# Patient Record
Sex: Female | Born: 1985
Health system: Southern US, Community
[De-identification: ages and names within clinical notes are randomized; demographics above are authoritative.]

## PROBLEM LIST (undated history)

## (undated) DIAGNOSIS — E669 Obesity, unspecified: Secondary | ICD-10-CM

## (undated) DIAGNOSIS — I10 Essential (primary) hypertension: Secondary | ICD-10-CM

## (undated) DIAGNOSIS — C419 Malignant neoplasm of bone and articular cartilage, unspecified: Secondary | ICD-10-CM

## (undated) DIAGNOSIS — Z9289 Personal history of other medical treatment: Secondary | ICD-10-CM

## (undated) DIAGNOSIS — F32A Depression, unspecified: Secondary | ICD-10-CM

## (undated) DIAGNOSIS — R519 Headache, unspecified: Secondary | ICD-10-CM

## (undated) DIAGNOSIS — C801 Malignant (primary) neoplasm, unspecified: Secondary | ICD-10-CM

## (undated) HISTORY — PX: TOTAL HIP ARTHROPLASTY: SHX124

## (undated) HISTORY — DX: Depression, unspecified: F32.A

## (undated) HISTORY — DX: Personal history of other medical treatment: Z92.89

## (undated) HISTORY — DX: Headache, unspecified: R51.9

## (undated) HISTORY — DX: Obesity, unspecified: E66.9

## (undated) HISTORY — PX: ABDOMINAL SURGERY: SHX537

---
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
## 2007-06-03 ENCOUNTER — Inpatient Hospital Stay (HOSPITAL_COMMUNITY): Admission: AD | Admit: 2007-06-03 | Discharge: 2007-06-03 | Payer: Self-pay | Admitting: Obstetrics and Gynecology

## 2007-06-10 ENCOUNTER — Ambulatory Visit (HOSPITAL_COMMUNITY): Admission: RE | Admit: 2007-06-10 | Discharge: 2007-06-10 | Payer: Self-pay | Admitting: *Deleted

## 2007-07-28 ENCOUNTER — Inpatient Hospital Stay (HOSPITAL_COMMUNITY): Admission: AD | Admit: 2007-07-28 | Discharge: 2007-07-29 | Payer: Self-pay | Admitting: Obstetrics & Gynecology

## 2007-08-01 ENCOUNTER — Inpatient Hospital Stay (HOSPITAL_COMMUNITY): Admission: AD | Admit: 2007-08-01 | Discharge: 2007-08-01 | Payer: Self-pay | Admitting: Obstetrics and Gynecology

## 2007-08-03 ENCOUNTER — Inpatient Hospital Stay (HOSPITAL_COMMUNITY): Admission: AD | Admit: 2007-08-03 | Discharge: 2007-08-06 | Payer: Self-pay | Admitting: Obstetrics and Gynecology

## 2009-06-22 ENCOUNTER — Emergency Department (HOSPITAL_COMMUNITY): Admission: EM | Admit: 2009-06-22 | Discharge: 2009-06-22 | Payer: Self-pay | Admitting: Emergency Medicine

## 2009-06-29 ENCOUNTER — Emergency Department (HOSPITAL_COMMUNITY): Admission: EM | Admit: 2009-06-29 | Discharge: 2009-06-29 | Payer: Self-pay | Admitting: Emergency Medicine

## 2009-09-20 ENCOUNTER — Inpatient Hospital Stay (HOSPITAL_COMMUNITY): Admission: AD | Admit: 2009-09-20 | Discharge: 2009-09-20 | Payer: Self-pay | Admitting: Obstetrics & Gynecology

## 2009-10-23 ENCOUNTER — Inpatient Hospital Stay (HOSPITAL_COMMUNITY): Admission: AD | Admit: 2009-10-23 | Discharge: 2009-10-23 | Payer: Self-pay | Admitting: Obstetrics and Gynecology

## 2009-11-09 ENCOUNTER — Inpatient Hospital Stay (HOSPITAL_COMMUNITY): Admission: AD | Admit: 2009-11-09 | Discharge: 2009-11-09 | Payer: Self-pay | Admitting: Obstetrics and Gynecology

## 2009-11-11 ENCOUNTER — Inpatient Hospital Stay (HOSPITAL_COMMUNITY): Admission: AD | Admit: 2009-11-11 | Discharge: 2009-11-11 | Payer: Self-pay | Admitting: Obstetrics & Gynecology

## 2009-11-16 ENCOUNTER — Inpatient Hospital Stay (HOSPITAL_COMMUNITY): Admission: AD | Admit: 2009-11-16 | Discharge: 2009-11-16 | Payer: Self-pay | Admitting: Obstetrics and Gynecology

## 2009-11-17 ENCOUNTER — Inpatient Hospital Stay (HOSPITAL_COMMUNITY): Admission: AD | Admit: 2009-11-17 | Discharge: 2009-11-19 | Payer: Self-pay | Admitting: Obstetrics and Gynecology

## 2009-12-22 ENCOUNTER — Inpatient Hospital Stay (HOSPITAL_COMMUNITY): Admission: AD | Admit: 2009-12-22 | Discharge: 2009-12-23 | Payer: Self-pay | Admitting: Obstetrics and Gynecology

## 2009-12-25 ENCOUNTER — Emergency Department (HOSPITAL_COMMUNITY): Admission: EM | Admit: 2009-12-25 | Discharge: 2009-12-26 | Payer: Self-pay | Admitting: Emergency Medicine

## 2009-12-28 ENCOUNTER — Encounter: Payer: Self-pay | Admitting: Cardiology

## 2010-06-28 ENCOUNTER — Emergency Department (HOSPITAL_COMMUNITY): Admission: EM | Admit: 2010-06-28 | Discharge: 2010-06-28 | Payer: Self-pay | Admitting: Emergency Medicine

## 2010-11-21 LAB — URINALYSIS, ROUTINE W REFLEX MICROSCOPIC
Bilirubin Urine: NEGATIVE
Glucose, UA: NEGATIVE mg/dL
Hgb urine dipstick: NEGATIVE
Ketones, ur: NEGATIVE mg/dL
Nitrite: NEGATIVE
Protein, ur: NEGATIVE mg/dL
Specific Gravity, Urine: 1.007 (ref 1.005–1.030)
Urobilinogen, UA: 0.2 mg/dL (ref 0.0–1.0)
pH: 7 (ref 5.0–8.0)

## 2010-11-21 LAB — BASIC METABOLIC PANEL
BUN: 9 mg/dL (ref 6–23)
CO2: 27 mEq/L (ref 19–32)
Calcium: 9.2 mg/dL (ref 8.4–10.5)
Chloride: 104 mEq/L (ref 96–112)
Creatinine, Ser: 0.84 mg/dL (ref 0.4–1.2)
GFR calc Af Amer: >60 mL/min (ref >60)
GFR calc non Af Amer: >60 mL/min (ref >60)
Glucose, Bld: 105 mg/dL — ABNORMAL HIGH (ref 70–99)
Potassium: 3.9 mEq/L (ref 3.5–5.1)
Sodium: 136 mEq/L (ref 135–145)

## 2010-11-21 LAB — POCT PREGNANCY, URINE: Preg Test, Ur: NEGATIVE

## 2010-11-21 LAB — D-DIMER, QUANTITATIVE: D-Dimer, Quant: 0.47 ug/mL-FEU (ref 0.00–0.48)

## 2010-11-25 LAB — COMPREHENSIVE METABOLIC PANEL
ALT: 18 U/L (ref 0–35)
AST: 30 U/L (ref 0–37)
Albumin: 2.5 g/dL — ABNORMAL LOW (ref 3.5–5.2)
Alkaline Phosphatase: 83 U/L (ref 39–117)
BUN: 4 mg/dL — ABNORMAL LOW (ref 6–23)
CO2: 21 mEq/L (ref 19–32)
Calcium: 8.8 mg/dL (ref 8.4–10.5)
Chloride: 106 mEq/L (ref 96–112)
Creatinine, Ser: 0.8 mg/dL (ref 0.4–1.2)
GFR calc Af Amer: >60 mL/min (ref >60)
GFR calc non Af Amer: >60 mL/min (ref >60)
Glucose, Bld: 88 mg/dL (ref 70–99)
Potassium: 3.8 mEq/L (ref 3.5–5.1)
Sodium: 136 mEq/L (ref 135–145)
Total Bilirubin: 0.6 mg/dL (ref 0.3–1.2)
Total Protein: 5.9 g/dL — ABNORMAL LOW (ref 6.0–8.3)

## 2010-11-25 LAB — URINALYSIS, ROUTINE W REFLEX MICROSCOPIC
Glucose, UA: NEGATIVE mg/dL
Hgb urine dipstick: NEGATIVE
Ketones, ur: >80 mg/dL — AB
Leukocytes, UA: NEGATIVE
Nitrite: NEGATIVE
Protein, ur: 30 mg/dL — AB
Specific Gravity, Urine: 1.025 (ref 1.005–1.030)
Urobilinogen, UA: 0.2 mg/dL (ref 0.0–1.0)
pH: 6.5 (ref 5.0–8.0)

## 2010-11-25 LAB — URINE MICROSCOPIC-ADD ON

## 2010-11-27 LAB — CBC
HCT: 34.9 % — ABNORMAL LOW (ref 36.0–46.0)
HCT: 35.3 % — ABNORMAL LOW (ref 36.0–46.0)
Hemoglobin: 11.3 g/dL — ABNORMAL LOW (ref 12.0–15.0)
Hemoglobin: 11.4 g/dL — ABNORMAL LOW (ref 12.0–15.0)
MCHC: 32 g/dL (ref 30.0–36.0)
MCHC: 32.6 g/dL (ref 30.0–36.0)
MCV: 74.9 fL — ABNORMAL LOW (ref 78.0–100.0)
MCV: 75.5 fL — ABNORMAL LOW (ref 78.0–100.0)
Platelets: 333 10*3/uL (ref 150–400)
Platelets: 352 10*3/uL (ref 150–400)
RBC: 4.65 MIL/uL (ref 3.87–5.11)
RBC: 4.68 MIL/uL (ref 3.87–5.11)
RDW: 16.6 % — ABNORMAL HIGH (ref 11.5–15.5)
RDW: 16.7 % — ABNORMAL HIGH (ref 11.5–15.5)
WBC: 6 10*3/uL (ref 4.0–10.5)
WBC: 8.2 10*3/uL (ref 4.0–10.5)

## 2010-11-27 LAB — DIFFERENTIAL
Basophils Absolute: 0.1 10*3/uL (ref 0.0–0.1)
Basophils Relative: 1 % (ref 0–1)
Eosinophils Absolute: 0.3 10*3/uL (ref 0.0–0.7)
Eosinophils Relative: 4 % (ref 0–5)
Lymphocytes Relative: 34 % (ref 12–46)
Lymphs Abs: 2.8 10*3/uL (ref 0.7–4.0)
Monocytes Absolute: 0.7 10*3/uL (ref 0.1–1.0)
Monocytes Relative: 9 % (ref 3–12)
Neutro Abs: 4.2 10*3/uL (ref 1.7–7.7)
Neutrophils Relative %: 52 % (ref 43–77)

## 2010-11-27 LAB — POCT CARDIAC MARKERS
CKMB, poc: <1 ng/mL — ABNORMAL LOW (ref 1.0–8.0)
Troponin i, poc: <0.05 ng/mL (ref 0.00–0.09)

## 2010-11-27 LAB — COMPREHENSIVE METABOLIC PANEL
ALT: 56 U/L — ABNORMAL HIGH (ref 0–35)
AST: 46 U/L — ABNORMAL HIGH (ref 0–37)
Albumin: 4 g/dL (ref 3.5–5.2)
Alkaline Phosphatase: 64 U/L (ref 39–117)
BUN: 13 mg/dL (ref 6–23)
CO2: 25 mEq/L (ref 19–32)
Calcium: 9.6 mg/dL (ref 8.4–10.5)
Chloride: 106 mEq/L (ref 96–112)
Creatinine, Ser: 0.86 mg/dL (ref 0.4–1.2)
GFR calc Af Amer: >60 mL/min (ref >60)
GFR calc non Af Amer: >60 mL/min (ref >60)
Glucose, Bld: 88 mg/dL (ref 70–99)
Potassium: 3.9 mEq/L (ref 3.5–5.1)
Sodium: 136 mEq/L (ref 135–145)
Total Bilirubin: 0.6 mg/dL (ref 0.3–1.2)
Total Protein: 7.2 g/dL (ref 6.0–8.3)

## 2010-11-27 LAB — URINALYSIS, ROUTINE W REFLEX MICROSCOPIC
Bilirubin Urine: NEGATIVE
Bilirubin Urine: NEGATIVE
Glucose, UA: NEGATIVE mg/dL
Glucose, UA: NEGATIVE mg/dL
Hgb urine dipstick: NEGATIVE
Hgb urine dipstick: NEGATIVE
Ketones, ur: NEGATIVE mg/dL
Ketones, ur: NEGATIVE mg/dL
Nitrite: NEGATIVE
Nitrite: NEGATIVE
Protein, ur: NEGATIVE mg/dL
Protein, ur: NEGATIVE mg/dL
Specific Gravity, Urine: 1.016 (ref 1.005–1.030)
Specific Gravity, Urine: 1.02 (ref 1.005–1.030)
Urobilinogen, UA: 0.2 mg/dL (ref 0.0–1.0)
Urobilinogen, UA: 1 mg/dL (ref 0.0–1.0)
pH: 6 (ref 5.0–8.0)
pH: 6.5 (ref 5.0–8.0)

## 2010-11-27 LAB — HEPATIC FUNCTION PANEL
ALT: 44 U/L — ABNORMAL HIGH (ref 0–35)
ALT: 52 U/L — ABNORMAL HIGH (ref 0–35)
AST: 36 U/L (ref 0–37)
AST: 40 U/L — ABNORMAL HIGH (ref 0–37)
Albumin: 3.8 g/dL (ref 3.5–5.2)
Alkaline Phosphatase: 58 U/L (ref 39–117)
Alkaline Phosphatase: 61 U/L (ref 39–117)
Bilirubin, Direct: 0.1 mg/dL (ref 0.0–0.3)
Bilirubin, Direct: 0.1 mg/dL (ref 0.0–0.3)
Indirect Bilirubin: 0.2 mg/dL — ABNORMAL LOW (ref 0.3–0.9)
Total Bilirubin: 0.3 mg/dL (ref 0.3–1.2)
Total Protein: 7.1 g/dL (ref 6.0–8.3)

## 2010-11-27 LAB — URIC ACID: Uric Acid, Serum: 5.8 mg/dL (ref 2.4–7.0)

## 2010-11-27 LAB — MRSA PCR SCREENING: MRSA by PCR: NEGATIVE

## 2010-11-27 LAB — BASIC METABOLIC PANEL
BUN: 16 mg/dL (ref 6–23)
CO2: 25 mEq/L (ref 19–32)
Calcium: 9.6 mg/dL (ref 8.4–10.5)
Chloride: 105 mEq/L (ref 96–112)
Creatinine, Ser: 1.45 mg/dL — ABNORMAL HIGH (ref 0.4–1.2)
GFR calc Af Amer: 54 mL/min — ABNORMAL LOW (ref >60)
GFR calc non Af Amer: 45 mL/min — ABNORMAL LOW (ref >60)
Glucose, Bld: 89 mg/dL (ref 70–99)
Potassium: 4.1 mEq/L (ref 3.5–5.1)
Sodium: 139 mEq/L (ref 135–145)

## 2010-11-27 LAB — D-DIMER, QUANTITATIVE: D-Dimer, Quant: 0.33 ug/mL-FEU (ref 0.00–0.48)

## 2010-11-27 LAB — URINE MICROSCOPIC-ADD ON

## 2010-11-27 LAB — LACTATE DEHYDROGENASE: LDH: 149 U/L (ref 94–250)

## 2010-11-27 LAB — ALT: ALT: 52 U/L — ABNORMAL HIGH (ref 0–35)

## 2010-11-27 LAB — AST: AST: 39 U/L — ABNORMAL HIGH (ref 0–37)

## 2010-11-27 LAB — MAGNESIUM: Magnesium: 6.1 mg/dL (ref 1.5–2.5)

## 2010-11-28 LAB — URINALYSIS, ROUTINE W REFLEX MICROSCOPIC
Hgb urine dipstick: NEGATIVE
Nitrite: NEGATIVE
Specific Gravity, Urine: 1.01 (ref 1.005–1.030)
Urobilinogen, UA: 0.2 mg/dL (ref 0.0–1.0)

## 2010-12-02 LAB — COMPREHENSIVE METABOLIC PANEL
ALT: 15 U/L (ref 0–35)
ALT: 16 U/L (ref 0–35)
ALT: 18 U/L (ref 0–35)
AST: 21 U/L (ref 0–37)
AST: 23 U/L (ref 0–37)
AST: 27 U/L (ref 0–37)
Albumin: 2.3 g/dL — ABNORMAL LOW (ref 3.5–5.2)
Alkaline Phosphatase: 102 U/L (ref 39–117)
BUN: 8 mg/dL (ref 6–23)
CO2: 23 mEq/L (ref 19–32)
CO2: 23 mEq/L (ref 19–32)
CO2: 25 mEq/L (ref 19–32)
Calcium: 8.8 mg/dL (ref 8.4–10.5)
Calcium: 9.1 mg/dL (ref 8.4–10.5)
Chloride: 105 mEq/L (ref 96–112)
Chloride: 106 mEq/L (ref 96–112)
Creatinine, Ser: 0.69 mg/dL (ref 0.4–1.2)
Creatinine, Ser: 0.79 mg/dL (ref 0.4–1.2)
GFR calc Af Amer: >60 mL/min (ref >60)
GFR calc Af Amer: >60 mL/min (ref >60)
GFR calc Af Amer: >60 mL/min (ref >60)
GFR calc non Af Amer: >60 mL/min (ref >60)
GFR calc non Af Amer: >60 mL/min (ref >60)
GFR calc non Af Amer: >60 mL/min (ref >60)
Glucose, Bld: 79 mg/dL (ref 70–99)
Potassium: 4.1 mEq/L (ref 3.5–5.1)
Sodium: 133 mEq/L — ABNORMAL LOW (ref 135–145)
Sodium: 135 mEq/L (ref 135–145)
Sodium: 135 mEq/L (ref 135–145)
Total Bilirubin: 0.2 mg/dL — ABNORMAL LOW (ref 0.3–1.2)
Total Bilirubin: 0.3 mg/dL (ref 0.3–1.2)
Total Protein: 4.9 g/dL — ABNORMAL LOW (ref 6.0–8.3)
Total Protein: 5.9 g/dL — ABNORMAL LOW (ref 6.0–8.3)

## 2010-12-02 LAB — RPR: RPR Ser Ql: NONREACTIVE

## 2010-12-02 LAB — URINALYSIS, ROUTINE W REFLEX MICROSCOPIC
Bilirubin Urine: NEGATIVE
Glucose, UA: 250 mg/dL — AB
Glucose, UA: NEGATIVE mg/dL
Ketones, ur: NEGATIVE mg/dL
Nitrite: NEGATIVE
Protein, ur: NEGATIVE mg/dL
Specific Gravity, Urine: 1.015 (ref 1.005–1.030)
Urobilinogen, UA: 1 mg/dL (ref 0.0–1.0)
pH: 7.5 (ref 5.0–8.0)

## 2010-12-02 LAB — URIC ACID
Uric Acid, Serum: 3.7 mg/dL (ref 2.4–7.0)
Uric Acid, Serum: 3.9 mg/dL (ref 2.4–7.0)

## 2010-12-02 LAB — CBC
HCT: 29.9 % — ABNORMAL LOW (ref 36.0–46.0)
Hemoglobin: 10.1 g/dL — ABNORMAL LOW (ref 12.0–15.0)
Hemoglobin: 9.3 g/dL — ABNORMAL LOW (ref 12.0–15.0)
MCHC: 32.5 g/dL (ref 30.0–36.0)
MCHC: 32.7 g/dL (ref 30.0–36.0)
MCV: 78.9 fL (ref 78.0–100.0)
MCV: 79.3 fL (ref 78.0–100.0)
Platelets: 272 10*3/uL (ref 150–400)
Platelets: 292 10*3/uL (ref 150–400)
RBC: 3.61 MIL/uL — ABNORMAL LOW (ref 3.87–5.11)
RBC: 3.84 MIL/uL — ABNORMAL LOW (ref 3.87–5.11)
RBC: 3.94 MIL/uL (ref 3.87–5.11)
RBC: 3.96 MIL/uL (ref 3.87–5.11)
RDW: 14.4 % (ref 11.5–15.5)
RDW: 14.7 % (ref 11.5–15.5)
WBC: 12.2 10*3/uL — ABNORMAL HIGH (ref 4.0–10.5)
WBC: 14.5 10*3/uL — ABNORMAL HIGH (ref 4.0–10.5)

## 2010-12-02 LAB — RH IMMUNE GLOB WKUP(>/=20WKS)(NOT WOMEN'S HOSP): Fetal Screen: NEGATIVE

## 2010-12-02 LAB — URINE MICROSCOPIC-ADD ON

## 2010-12-13 LAB — POCT I-STAT, CHEM 8
BUN: 4 mg/dL — ABNORMAL LOW (ref 6–23)
Calcium, Ion: 1.23 mmol/L (ref 1.12–1.32)
Chloride: 105 meq/L (ref 96–112)
Creatinine, Ser: 0.5 mg/dL (ref 0.4–1.2)
Creatinine, Ser: 0.6 mg/dL (ref 0.4–1.2)
Glucose, Bld: 131 mg/dL — ABNORMAL HIGH (ref 70–99)
Glucose, Bld: 99 mg/dL (ref 70–99)
HCT: 33 % — ABNORMAL LOW (ref 36.0–46.0)
HCT: 35 % — ABNORMAL LOW (ref 36.0–46.0)
Hemoglobin: 11.2 g/dL — ABNORMAL LOW (ref 12.0–15.0)
Hemoglobin: 11.9 g/dL — ABNORMAL LOW (ref 12.0–15.0)
Potassium: 3.5 mEq/L (ref 3.5–5.1)
Potassium: 3.9 meq/L (ref 3.5–5.1)
Sodium: 136 mEq/L (ref 135–145)
Sodium: 137 meq/L (ref 135–145)
TCO2: 22 mmol/L (ref 0–100)
TCO2: 22 mmol/L (ref 0–100)

## 2010-12-13 LAB — URINALYSIS, ROUTINE W REFLEX MICROSCOPIC
Bilirubin Urine: NEGATIVE
Glucose, UA: 250 mg/dL — AB
Ketones, ur: NEGATIVE mg/dL
Nitrite: NEGATIVE
Specific Gravity, Urine: 1.016 (ref 1.005–1.030)
pH: 8 (ref 5.0–8.0)

## 2010-12-13 LAB — GLUCOSE, CAPILLARY: Glucose-Capillary: 97 mg/dL (ref 70–99)

## 2010-12-13 LAB — URINE MICROSCOPIC-ADD ON

## 2011-03-18 ENCOUNTER — Other Ambulatory Visit: Payer: Self-pay | Admitting: Cardiology

## 2011-03-18 MED ORDER — LOSARTAN POTASSIUM 50 MG PO TABS: 50.0000 mg | ORAL_TABLET | Freq: Every day | ORAL | Status: DC

## 2011-03-18 NOTE — Telephone Encounter (Signed)
Med refill

## 2011-04-24 ENCOUNTER — Other Ambulatory Visit: Payer: Self-pay | Admitting: *Deleted

## 2011-04-24 MED ORDER — LOSARTAN POTASSIUM 50 MG PO TABS: 50.0000 mg | ORAL_TABLET | Freq: Every day | ORAL | Status: DC

## 2011-04-24 NOTE — Telephone Encounter (Signed)
escribe medication per fax request  

## 2011-05-31 ENCOUNTER — Other Ambulatory Visit: Payer: Self-pay | Admitting: *Deleted

## 2011-05-31 MED ORDER — LOSARTAN POTASSIUM 50 MG PO TABS: 50.0000 mg | ORAL_TABLET | Freq: Every day | ORAL | Status: DC

## 2011-05-31 NOTE — Telephone Encounter (Signed)
Refilled Meds from fax  

## 2011-06-17 ENCOUNTER — Other Ambulatory Visit: Payer: Self-pay | Admitting: Nurse Practitioner

## 2011-06-17 NOTE — Telephone Encounter (Signed)
Pt out of Losartan, Lawson Fiscal Gerhardt's name is on refill. Please call into [facility] in [facility]. Pt knows she needs to make appt but doesn't have insurance right now and pt said her health condition hasn't changed she just needs the RX to manage her HTN.   According to pt records pt was transferred from Dr. Deborah Chalk to Dr. Cyndia Bent but pt hasn't ever seen Dr. Cyndia Bent.

## 2011-06-17 NOTE — Telephone Encounter (Signed)
Pt called. Pt wants refill of losartan [facility] [place] pt is out of pills pharmacy has faxed twice

## 2011-06-18 LAB — CBC
HCT: 30 — ABNORMAL LOW
HCT: 32.5 — ABNORMAL LOW
HCT: 36
Hemoglobin: 10.5 — ABNORMAL LOW
MCHC: 34.4
MCHC: 35.1
MCV: 83.8
MCV: 83.9
MCV: 85.2
Platelets: 281
RBC: 4.01
RBC: 4.29
RDW: 13.5
RDW: 14
RDW: 14
WBC: 20.7 — ABNORMAL HIGH

## 2011-06-18 LAB — COMPREHENSIVE METABOLIC PANEL
ALT: 16
AST: 22
Alkaline Phosphatase: 67
BUN: 13
Calcium: 9.7
Creatinine, Ser: 0.75
GFR calc Af Amer: >60
Glucose, Bld: 104 — ABNORMAL HIGH
Potassium: 3.8
Sodium: 130 — ABNORMAL LOW
Total Protein: 6
Total Protein: 6.3

## 2011-06-18 LAB — URINALYSIS, ROUTINE W REFLEX MICROSCOPIC
Bilirubin Urine: NEGATIVE
Bilirubin Urine: NEGATIVE
Glucose, UA: NEGATIVE
Glucose, UA: NEGATIVE
Hgb urine dipstick: NEGATIVE
Hgb urine dipstick: NEGATIVE
Ketones, ur: 15 — AB
Ketones, ur: NEGATIVE
Ketones, ur: NEGATIVE
Nitrite: NEGATIVE
Protein, ur: NEGATIVE
Specific Gravity, Urine: 1.015
Urobilinogen, UA: 0.2
Urobilinogen, UA: 0.2
pH: 6

## 2011-06-18 LAB — URIC ACID: Uric Acid, Serum: 4.1

## 2011-06-18 LAB — LACTATE DEHYDROGENASE: LDH: 125

## 2011-06-18 MED ORDER — LOSARTAN POTASSIUM 50 MG PO TABS: 50.0000 mg | ORAL_TABLET | Freq: Every day | ORAL | Status: AC

## 2011-06-18 NOTE — Telephone Encounter (Signed)
CALLED PATIENT TO LET HER KNOW THAT [facility] WILL GIVE HER 3 REFILLS BUT AFTERWARDS SHE HAS TO MAKE AN APPT. PATIENT DID NOT ANSWER. I HAVE REFILLED THE RX

## 2011-06-20 LAB — URINALYSIS, ROUTINE W REFLEX MICROSCOPIC
Glucose, UA: NEGATIVE
Leukocytes, UA: NEGATIVE
Nitrite: NEGATIVE
Protein, ur: 30 — AB

## 2011-06-20 LAB — RH IMMUNE GLOBULIN WORKUP (NOT WOMEN'S HOSP)

## 2012-01-20 ENCOUNTER — Emergency Department (HOSPITAL_COMMUNITY)
Admission: EM | Admit: 2012-01-20 | Discharge: 2012-01-20 | Disposition: A | Discharge status: Home / Self Care | Payer: 59 | Source: Home / Self Care | Attending: Emergency Medicine | Admitting: Emergency Medicine

## 2012-01-20 ENCOUNTER — Encounter (HOSPITAL_COMMUNITY): Payer: Self-pay

## 2012-01-20 DIAGNOSIS — H109 Unspecified conjunctivitis: Secondary | ICD-10-CM

## 2012-01-20 HISTORY — DX: Essential (primary) hypertension: I10

## 2012-01-20 MED ORDER — TETRACAINE HCL 0.5 % OP SOLN
OPHTHALMIC | Status: AC
  Filled 2012-01-20: qty 2

## 2012-01-20 MED ORDER — CIPROFLOXACIN HCL 0.3 % OP SOLN: OPHTHALMIC | Status: DC

## 2012-01-20 MED ORDER — TETRACAINE HCL 0.5 % OP SOLN
1.0000 [drp] | Freq: Once | OPHTHALMIC | Status: AC
  Administered 2012-01-20: 1 [drp] via OPHTHALMIC

## 2012-01-20 NOTE — ED Provider Notes (Signed)
Chief Complaint  Patient presents with  . 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 She denies any eye pain, photophobia, or change in her vision. She has not had any involvement of the right eye. She notes no nasal congestion, rhinorrhea, sore throat, adenopathy, cough, or fever.  Review of Systems:  Other than noted above, the patient denies any of the following symptoms: Systemic:  No fever, chills, sweats, fatigue, or weight loss. Eye:  No redness, eye pain, photophobia, discharge, blurred vision, or diplopia. ENT:  No nasal congestion, rhinorrhea, or sore throat. Lymphatic:  No adenopathy. Skin:  No rash or pruritis.  PMFSH:  Past medical history, family history, social history, meds, and allergies were reviewed.  Physical Exam:   Vital signs:  BP 115/77  Pulse 77  Temp(Src) 98.7 F (37.1 C) (Oral)  Resp 18  SpO2 100%  LMP 01/05/2012 General:  Alert and in no distress. Eye:  The lids and periorbital tissues are normal. She has moderate injection of the conjunctiva of the left eye with no discharge or crusting. Cornea was intact a fluorescein staining. Anterior chamber was normal. Funduscopic exam was normal. PERRLA, full EOMs. ENT:  TMs and canals clear.  Nasal mucosa normal.  No intra-oral lesions, mucous membranes moist, pharynx clear. Neck:  No adenopathy tenderness or mass. Skin:  Clear, warm and dry.  Assessment:  The encounter diagnosis was Conjunctivitis.  Plan:   1.  The following meds were prescribed:   New Prescriptions   CIPROFLOXACIN (CILOXAN) 0.3 % OPHTHALMIC SOLUTION    1 drop in affected eye every 3 hours while awake.   2.  The patient was instructed in symptomatic care and handouts were given. 3.  The patient was told to return if becoming worse in any way, if no better in 3 or 4  days, and given some red flag symptoms that would indicate earlier return.     Reuben Likes, MD 01/20/12 [phone]

## 2012-01-20 NOTE — Discharge Instructions (Signed)
Conjunctivitis Conjunctivitis is commonly called "pink eye." Conjunctivitis can be caused by bacterial or viral infection, allergies, or injuries. There is usually redness of the lining of the eye, itching, discomfort, and sometimes discharge. There may be deposits of matter along the eyelids. A viral infection usually causes a watery discharge, while a bacterial infection causes a yellowish, thick discharge. Pink eye is very contagious and spreads by direct contact. You may be given antibiotic eyedrops as part of your treatment. Before using your eye medicine, remove all drainage from the eye by washing gently with warm water and cotton balls. Continue to use the medication until you have awakened 2 mornings in a row without discharge from the eye. Do not rub your eye. This increases the irritation and helps spread infection. Use separate towels from other household members. Wash your hands with soap and water before and after touching your eyes. Use cold compresses to reduce pain and sunglasses to relieve irritation from light. Do not wear contact lenses or wear eye makeup until the infection is gone. SEEK MEDICAL CARE IF:   Your symptoms are not better after 3 days of treatment.   You have increased pain or trouble seeing.   The outer eyelids become very red or swollen.  Document Released: 10/03/2004 Document Revised: 08/15/2011 Document Reviewed: 08/26/2005 Exit[employer] Patient Information 2012 [employer]. 

## 2012-01-20 NOTE — ED Notes (Signed)
Sent here by her manager to r/o pinkeye; woke w eye swellnig, matted, yellow drainage; denies injury

## 2012-01-27 ENCOUNTER — Encounter: Payer: Self-pay | Admitting: *Deleted

## 2012-06-30 ENCOUNTER — Emergency Department (HOSPITAL_COMMUNITY)
Admission: EM | Admit: 2012-06-30 | Discharge: 2012-06-30 | Disposition: A | Discharge status: Home / Self Care | Payer: 59 | Source: Home / Self Care | Attending: Family Medicine | Admitting: Family Medicine

## 2012-06-30 ENCOUNTER — Emergency Department (INDEPENDENT_AMBULATORY_CARE_PROVIDER_SITE_OTHER): Payer: 59

## 2012-06-30 ENCOUNTER — Encounter (HOSPITAL_COMMUNITY): Payer: Self-pay | Admitting: Emergency Medicine

## 2012-06-30 DIAGNOSIS — M25559 Pain in unspecified hip: Secondary | ICD-10-CM

## 2012-06-30 DIAGNOSIS — M25552 Pain in left hip: Secondary | ICD-10-CM

## 2012-06-30 MED ORDER — CYCLOBENZAPRINE HCL 10 MG PO TABS: 10.0000 mg | ORAL_TABLET | Freq: Two times a day (BID) | ORAL | Status: DC | PRN

## 2012-06-30 MED ORDER — TRAMADOL HCL 50 MG PO TABS: 50.0000 mg | ORAL_TABLET | Freq: Four times a day (QID) | ORAL | Status: DC | PRN

## 2012-06-30 MED ORDER — NAPROXEN 500 MG PO TABS: 500.0000 mg | ORAL_TABLET | Freq: Two times a day (BID) | ORAL | Status: DC

## 2012-06-30 NOTE — ED Notes (Addendum)
Pt states for the past two months she has been having pain in her left hip and knee.   Pt c/o a constant throbbing pain in hip/ knee. Hip area feels like it needs to be popped. Pt had husband to rotate hip to try and make it pop but no relief.  Her left leg is dead weight to were she has to pick leg up to put it in car.   Hip pain is worse with rotation and causing knee to hurt and making her walk with a limp.  Pt has tried ibuprofen with only little relief but seems to not be working now.  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

## 2012-06-30 NOTE — ED Provider Notes (Signed)
History     CSN: [id number]  Arrival date & time 06/30/12  1549   First MD Initiated Contact with Patient 06/30/12 1701      Chief Complaint  Patient presents with  . Hip Pain  . Knee Pain    (Consider location/radiation/quality/duration/timing/severity/associated sxs/prior treatment) HPI Comments: 26 year old female here complaining of 2 month left anterior hip pain with intermittent exacerbations worse with walking or elevating the left thigh needs to carry her leg up to get in the car due to pain. But denies weakness. Also hip rotation agravates her pain. Pain worse in the last few days. Patient describes as she has the feeling that her "hip needs to pop back in place for the pain to resolve". 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 Denies any falls or direct injury to her hip. No back pain. No associated low extremity numbness, weakness or paresthesias. Denies general malaise fever or chills. Denies abdominal pain, pelvic pain, dysuria or hematuria. No vaginal discharge. The patient reports she started to have associated left knee pain during the last few weeks. She has taken inconsistently ibuprofen over-the-counter with minimal relief.   Past Medical History  Diagnosis Date  . Hypertension   . Obesity     Past Surgical History  Procedure Date  . None'     Family History  Problem Relation Age of Onset  . Hypertension    . Cancer      History  Substance Use Topics  . Smoking status: Never Smoker   . Smokeless tobacco: Not on file  . Alcohol Use: Yes     socially    OB History    Grav Para Term Preterm Abortions TAB SAB Ect Mult Living                  Review of Systems  Constitutional: Negative for fever and chills.  Genitourinary: Negative for dysuria, frequency, hematuria, flank pain, vaginal bleeding, menstrual problem and pelvic pain.  Musculoskeletal:  Negative for back pain and joint swelling.       As per history of present illness  Skin: Negative for rash.    Allergies  Review of patient's allergies indicates no known allergies.  Home Medications   Current Outpatient Rx  Name Route Sig Dispense Refill  . BENAZEPRIL HCL 10 MG PO TABS Oral Take 10 mg by mouth daily.    Marland Kitchen CIPROFLOXACIN HCL 0.3 % OP SOLN  1 drop in affected eye every 3 hours while awake. 5 mL 0  . CYCLOBENZAPRINE HCL 10 MG PO TABS Oral Take 1 tablet (10 mg total) by mouth 2 (two) times daily as needed for muscle spasms. 20 tablet 0  . FLUOXETINE HCL 20 MG PO TABS Oral Take 20 mg by mouth daily.      Marland Kitchen NAPROXEN 500 MG PO TABS Oral Take 1 tablet (500 mg total) by mouth 2 (two) times daily with a meal. 20 tablet 0  . TRAMADOL HCL 50 MG PO TABS Oral Take 1 tablet (50 mg total) by mouth every 6 (six) hours as needed for pain. 20 tablet 0    BP 111/87  Pulse 97  Temp 99.5 F (37.5 C) (Oral)  Resp 16  SpO2 100%  LMP 06/16/2012  Physical Exam  Nursing note and vitals reviewed. Constitutional: She is oriented to person, place, and time. She appears well-developed and well-nourished. No  distress.       Overweight  HENT:  Head: Normocephalic and atraumatic.  Cardiovascular: Normal heart sounds.   Pulmonary/Chest: Breath sounds normal.  Abdominal: Soft. She exhibits no distension. There is no tenderness.       No costovertebral tenderness  Musculoskeletal:       Left Hip: No erythema or rash. No obvious swelling or deformity. Tender to palpation anteriority. Impress increased tone and tenderness to palpation over iliofemoral ligament. Pain with active left thigh flexion. Increased pain with external rotation. No clicks. No tenderness to palpation over SI joint.  Spine: Central. No tenderness over bony prominences. No paravertebral tenderness. Full range of motion. Entire left lower extremity appears neurovascularly intact.   Neurological: She is alert and oriented to  person, place, and time.  Skin: No rash noted. She is not diaphoretic.    ED Course  Procedures (including critical care time)  Labs Reviewed - No data to display Dg Hip Complete Left  06/30/2012  *RADIOLOGY REPORT*  Clinical Data: Left hip pain.  No known injury.  LEFT HIP - COMPLETE 2+ VIEW  Comparison: None.  Findings: No evidence of left hip fracture or dislocation.  Degenerative spurring is seen involving the femoral head without significant joint space narrowing.  Cystic lucency is also seen in the left femoral head and neck, suspicious for subchondral geode formation given the degenerative spurring.  IMPRESSION:  1.  No acute findings. 2.  Left femoral head degenerative spurring.  Cystic lucency in the left femoral head and neck is suspicious for subchondral geode formation.   Non-emergent hip MRI suggested for further evaluation.   Original Report Authenticated By: Danae Orleans, M.D.      1. Hip pain, left       MDM  Discussed x-ray findings with patient. Prescribe naproxen, tramadol and Flexeril. Recommended orthopedic followup.        Sharin Grave, MD 07/02/12 [phone]

## 2012-07-09 ENCOUNTER — Other Ambulatory Visit (HOSPITAL_COMMUNITY): Payer: Self-pay | Admitting: Orthopedic Surgery

## 2012-07-09 DIAGNOSIS — M8700 Idiopathic aseptic necrosis of unspecified bone: Secondary | ICD-10-CM

## 2012-07-10 ENCOUNTER — Ambulatory Visit (HOSPITAL_COMMUNITY)
Admission: RE | Admit: 2012-07-10 | Discharge: 2012-07-10 | Disposition: A | Discharge status: Home / Self Care | Payer: 59 | Source: Ambulatory Visit | Attending: Orthopedic Surgery | Admitting: Orthopedic Surgery

## 2012-07-10 ENCOUNTER — Other Ambulatory Visit (HOSPITAL_COMMUNITY): Payer: 59

## 2012-07-10 DIAGNOSIS — M25559 Pain in unspecified hip: Secondary | ICD-10-CM | POA: Insufficient documentation

## 2012-07-10 DIAGNOSIS — M8700 Idiopathic aseptic necrosis of unspecified bone: Secondary | ICD-10-CM | POA: Insufficient documentation

## 2012-07-10 LAB — CREATININE, SERUM
GFR calc Af Amer: >90 mL/min (ref >90)
GFR calc non Af Amer: >90 mL/min (ref >90)

## 2012-07-10 MED ORDER — GADOBENATE DIMEGLUMINE 529 MG/ML IV SOLN: 17.0000 mL | Freq: Once | INTRAVENOUS | Status: AC | PRN

## 2012-08-17 ENCOUNTER — Encounter (HOSPITAL_COMMUNITY): Payer: Self-pay

## 2012-08-17 ENCOUNTER — Emergency Department (HOSPITAL_COMMUNITY): Payer: 59

## 2012-08-17 ENCOUNTER — Emergency Department (HOSPITAL_COMMUNITY)
Admission: EM | Admit: 2012-08-17 | Discharge: 2012-08-17 | Disposition: A | Discharge status: Home / Self Care | Payer: 59 | Attending: Emergency Medicine | Admitting: Emergency Medicine

## 2012-08-17 DIAGNOSIS — R002 Palpitations: Secondary | ICD-10-CM | POA: Insufficient documentation

## 2012-08-17 DIAGNOSIS — Z96649 Presence of unspecified artificial hip joint: Secondary | ICD-10-CM | POA: Insufficient documentation

## 2012-08-17 DIAGNOSIS — Z859 Personal history of malignant neoplasm, unspecified: Secondary | ICD-10-CM | POA: Insufficient documentation

## 2012-08-17 DIAGNOSIS — R42 Dizziness and giddiness: Secondary | ICD-10-CM | POA: Insufficient documentation

## 2012-08-17 DIAGNOSIS — I10 Essential (primary) hypertension: Secondary | ICD-10-CM | POA: Insufficient documentation

## 2012-08-17 DIAGNOSIS — E669 Obesity, unspecified: Secondary | ICD-10-CM | POA: Insufficient documentation

## 2012-08-17 DIAGNOSIS — Z79899 Other long term (current) drug therapy: Secondary | ICD-10-CM | POA: Insufficient documentation

## 2012-08-17 HISTORY — DX: Malignant (primary) neoplasm, unspecified: C80.1

## 2012-08-17 LAB — COMPREHENSIVE METABOLIC PANEL
AST: 23 U/L (ref 0–37)
Albumin: 3.3 g/dL — ABNORMAL LOW (ref 3.5–5.2)
Alkaline Phosphatase: 69 U/L (ref 39–117)
CO2: 29 mEq/L (ref 19–32)
Chloride: 103 mEq/L (ref 96–112)
Creatinine, Ser: 0.78 mg/dL (ref 0.50–1.10)
GFR calc non Af Amer: >90 mL/min (ref >90)
Potassium: 4.7 mEq/L (ref 3.5–5.1)
Total Bilirubin: 0.2 mg/dL — ABNORMAL LOW (ref 0.3–1.2)

## 2012-08-17 LAB — URINALYSIS, ROUTINE W REFLEX MICROSCOPIC
Glucose, UA: NEGATIVE mg/dL
Ketones, ur: NEGATIVE mg/dL
Nitrite: NEGATIVE
Specific Gravity, Urine: 1.008 (ref 1.005–1.030)
pH: 7 (ref 5.0–8.0)

## 2012-08-17 LAB — CBC WITH DIFFERENTIAL/PLATELET
Basophils Absolute: 0.1 10*3/uL (ref 0.0–0.1)
HCT: 33 % — ABNORMAL LOW (ref 36.0–46.0)
Hemoglobin: 10.7 g/dL — ABNORMAL LOW (ref 12.0–15.0)
Lymphocytes Relative: 23 % (ref 12–46)
Monocytes Absolute: 0.7 10*3/uL (ref 0.1–1.0)
Monocytes Relative: 9 % (ref 3–12)
Neutro Abs: 5 10*3/uL (ref 1.7–7.7)
Neutrophils Relative %: 63 % (ref 43–77)
RDW: 12.8 % (ref 11.5–15.5)
WBC: 7.9 10*3/uL (ref 4.0–10.5)

## 2012-08-17 LAB — TYPE AND SCREEN

## 2012-08-17 LAB — URINE MICROSCOPIC-ADD ON

## 2012-08-17 MED ORDER — HYDROMORPHONE HCL 2 MG PO TABS
4.0000 mg | ORAL_TABLET | Freq: Once | ORAL | Status: AC
  Administered 2012-08-17: 4 mg via ORAL
  Filled 2012-08-17: qty 2

## 2012-08-17 MED ORDER — ENOXAPARIN SODIUM 40 MG/0.4ML ~~LOC~~ SOLN
40.0000 mg | Freq: Once | SUBCUTANEOUS | Status: AC
  Administered 2012-08-17: 40 mg via SUBCUTANEOUS
  Filled 2012-08-17: qty 0.4

## 2012-08-17 MED ORDER — CIPROFLOXACIN HCL 500 MG PO TABS: 500.0000 mg | ORAL_TABLET | Freq: Two times a day (BID) | ORAL | Status: DC

## 2012-08-17 MED ORDER — CIPROFLOXACIN HCL 500 MG PO TABS
500.0000 mg | ORAL_TABLET | Freq: Once | ORAL | Status: AC
  Administered 2012-08-17: 500 mg via ORAL
  Filled 2012-08-17: qty 1

## 2012-08-17 MED ORDER — SODIUM CHLORIDE 0.9 % IV BOLUS (SEPSIS)
1000.0000 mL | Freq: Once | INTRAVENOUS | Status: AC
  Administered 2012-08-17: 1000 mL via INTRAVENOUS

## 2012-08-17 NOTE — ED Notes (Signed)
NAD noted at time of d/c home with family 

## 2012-08-17 NOTE — ED Provider Notes (Signed)
History     CSN: [id number]  Arrival date & time 08/17/12  1334   First MD Initiated Contact with Patient 08/17/12 1400      Chief Complaint  Patient presents with  . Tachycardia    (Consider location/radiation/quality/duration/timing/severity/associated sxs/prior treatment) The history is provided by the patient.  pt states in past 1-2 days episodes palpitations and lightheadedness, worse w standing. States when lying flat feels improved. Denies hx same. Had left hip surgery/replacement 11/29 at [facility] ('clear cell tumor') - states went well, no complications. Mild left leg swelling since surgery, no recent change. Is on lovenox. w standing, states will get warm/hot feeling, faint/lightheaded. Denies passing out.  No other previous hx palpitations or rhythm disturbance. No preceding heat intolerance, sweats, wt change or hx thyroid disease. States post surgical hip pain controlled, no recent exacerbation of pain or increased swelling. Denies fever or chills. No gu c/o. No cough, sob or uri c/o. No chest pain. No abd pain. No nvd. Hx anemia. States was told hgb 8.4 post surgery. No recent blood loss, just finished normal period, no unusual or heavy bleeding. No rectal bleeding or melena.     Past Medical History  Diagnosis Date  . Hypertension   . Obesity   . Cancer     Past Surgical History  Procedure Date  . None'     Family History  Problem Relation Age of Onset  . Hypertension    . Cancer      History  Substance Use Topics  . Smoking status: Never Smoker   . Smokeless tobacco: Not on file  . Alcohol Use: Yes     Comment: socially    OB History    Grav Para Term Preterm Abortions TAB SAB Ect Mult Living                  Review of Systems  Constitutional: Negative for fever and chills.  HENT: Negative for neck pain.   Eyes: Negative for redness.  Respiratory: Negative for shortness of breath.   Cardiovascular: Negative for chest pain.  Gastrointestinal:  Negative for vomiting, abdominal pain, diarrhea and blood in stool.  Genitourinary: Negative for dysuria and flank pain.  Musculoskeletal: Negative for back pain.  Skin: Negative for rash.  Neurological: Negative for headaches.  Hematological: Does not bruise/bleed easily.  Psychiatric/Behavioral: Negative for confusion.    Allergies  Review of patient's allergies indicates no known allergies.  Home Medications   Current Outpatient Rx  Name  Route  Sig  Dispense  Refill  . BENAZEPRIL HCL 10 MG PO TABS   Oral   Take 10 mg by mouth daily.         Marland Kitchen CIPROFLOXACIN HCL 0.3 % OP SOLN      1 drop in affected eye every 3 hours while awake.   5 mL   0   . CYCLOBENZAPRINE HCL 10 MG PO TABS   Oral   Take 1 tablet (10 mg total) by mouth 2 (two) times daily as needed for muscle spasms.   20 tablet   0   . FLUOXETINE HCL 20 MG PO TABS   Oral   Take 20 mg by mouth daily.           Marland Kitchen NAPROXEN 500 MG PO TABS   Oral   Take 1 tablet (500 mg total) by mouth 2 (two) times daily with a meal.   20 tablet   0   . TRAMADOL HCL 50 MG  PO TABS   Oral   Take 1 tablet (50 mg total) by mouth every 6 (six) hours as needed for pain.   20 tablet   0     There were no vitals taken for this visit.  Physical Exam  Nursing note and vitals reviewed. Constitutional: She is oriented to person, place, and time. She appears well-developed and well-nourished. No distress.  HENT:  Nose: Nose normal.  Mouth/Throat: Oropharynx is clear and moist.  Eyes: Conjunctivae normal are normal. No scleral icterus.  Neck: Neck supple. No tracheal deviation present.  Cardiovascular: Normal rate, regular rhythm, normal heart sounds and intact distal pulses.   Pulmonary/Chest: Effort normal and breath sounds normal. No respiratory distress.  Abdominal: Soft. Normal appearance and bowel sounds are normal. She exhibits no distension.  Genitourinary:       No cva tenderness  Musculoskeletal: She exhibits no  edema and no tenderness.       Left leg very mildly swollen as compared to right. Distal pulses palp. No erythema or purulent drainage at surgical site.   Neurological: She is alert and oriented to person, place, and time.  Skin: Skin is warm and dry. No rash noted.  Psychiatric: She has a normal mood and affect.    ED Course  Procedures (including critical care time)   Labs Reviewed  CBC WITH DIFFERENTIAL  COMPREHENSIVE METABOLIC PANEL  TSH  T4, FREE  TYPE AND SCREEN   Results for orders placed during the hospital encounter of 08/17/12  CBC WITH DIFFERENTIAL      Component Value Range   WBC 7.9  4.0 - 10.5 K/uL   RBC 3.95  3.87 - 5.11 MIL/uL   Hemoglobin 10.7 (*) 12.0 - 15.0 g/dL   HCT 45.4 (*) 09.8 - 11.9 %   MCV 83.5  78.0 - 100.0 fL   MCH 27.1  26.0 - 34.0 pg   MCHC 32.4  30.0 - 36.0 g/dL   RDW 14.7  82.9 - 56.2 %   Platelets 536 (*) 150 - 400 K/uL   Neutrophils Relative 63  43 - 77 %   Neutro Abs 5.0  1.7 - 7.7 K/uL   Lymphocytes Relative 23  12 - 46 %   Lymphs Abs 1.8  0.7 - 4.0 K/uL   Monocytes Relative 9  3 - 12 %   Monocytes Absolute 0.7  0.1 - 1.0 K/uL   Eosinophils Relative 4  0 - 5 %   Eosinophils Absolute 0.3  0.0 - 0.7 K/uL   Basophils Relative 1  0 - 1 %   Basophils Absolute 0.1  0.0 - 0.1 K/uL  COMPREHENSIVE METABOLIC PANEL      Component Value Range   Sodium 139  135 - 145 mEq/L   Potassium 4.7  3.5 - 5.1 mEq/L   Chloride 103  96 - 112 mEq/L   CO2 29  19 - 32 mEq/L   Glucose, Bld 102 (*) 70 - 99 mg/dL   BUN 11  6 - 23 mg/dL   Creatinine, Ser 1.30  0.50 - 1.10 mg/dL   Calcium 86.5  8.4 - 78.4 mg/dL   Total Protein 7.8  6.0 - 8.3 g/dL   Albumin 3.3 (*) 3.5 - 5.2 g/dL   AST 23  0 - 37 U/L   ALT 20  0 - 35 U/L   Alkaline Phosphatase 69  39 - 117 U/L   Total Bilirubin 0.2 (*) 0.3 - 1.2 mg/dL   GFR calc non  Af Amer >90  >90 mL/min   GFR calc Af Amer >90  >90 mL/min  TYPE AND SCREEN      Component Value Range   ABO/RH(D) A NEG     Antibody Screen  NEG     Sample Expiration 08/20/2012    URINALYSIS, ROUTINE W REFLEX MICROSCOPIC      Component Value Range   Color, Urine YELLOW  YELLOW   APPearance HAZY (*) CLEAR   Specific Gravity, Urine 1.008  1.005 - 1.030   pH 7.0  5.0 - 8.0   Glucose, UA NEGATIVE  NEGATIVE mg/dL   Hgb urine dipstick NEGATIVE  NEGATIVE   Bilirubin Urine NEGATIVE  NEGATIVE   Ketones, ur NEGATIVE  NEGATIVE mg/dL   Protein, ur NEGATIVE  NEGATIVE mg/dL   Urobilinogen, UA 1.0  0.0 - 1.0 mg/dL   Nitrite NEGATIVE  NEGATIVE   Leukocytes, UA MODERATE (*) NEGATIVE  URINE MICROSCOPIC-ADD ON      Component Value Range   Squamous Epithelial / LPF MANY (*) RARE   WBC, UA 7-10  <3 WBC/hpf   Bacteria, UA FEW (*) RARE   Dg Chest 2 View  08/17/2012  *RADIOLOGY REPORT*  Clinical Data: Palpitations.  Tachycardia.  CHEST - 2 VIEW  Comparison: 06/28/2010  Findings: Artifact overlies chest.  Heart size is normal. Mediastinal shadows are normal.  The vascularity is normal.  The lungs are clear.  No effusions.  No bony abnormalities.  IMPRESSION: Normal chest   Original Report Authenticated By: Paulina Fusi, M.D.        MDM  Monitor. Labs. Ecg. Iv ns bolus.   Reviewed nursing notes and prior charts for additional history.     Date: 08/17/2012  Rate: 93  Rhythm: normal sinus rhythm  QRS Axis: normal  Intervals: normal  ST/T Wave abnormalities: normal  Conduction Disutrbances:none  Narrative Interpretation:   Old EKG Reviewed: unchanged   Pt states is due for her normal lovenox 40 mg, and dilaudid 4 mg po - meds given in ed.   No recurrent palpitations or rapid heartbeat in ed. Remains in nsr.   Pt asymptomatic on recheck. Denies any current or recent cp or discomfort of any sort. Denies any sob. Hip incision without sign of infection. Tolerating po fluids well.   Pt reports recent hgb mid 8 range, now 10.7 c/w prior.  Remains in nsr and asymptomatic, tolerating po fluids.  Given recent palpitations and  ?tachycardia,  discussed referral for possible holter. Also discussed that tfts remain pending. Also discussed on labs urine hazy, mod le, 7-10 wbc, few bact, but also some epis - will cx urine and will rx as in recent post op period.         Suzi Roots, MD 08/17/12 [phone]

## 2012-08-17 NOTE — ED Notes (Signed)
Per ems- pt had hip replacement nov 29. Pt states she was dizzy last night. Today pt states she has been lightheaded upon standing, felt like heart was racing. HR with ems 100-115 while laying down, 160 when standing. Pt states when she stands up, pt c/o pressure in chest. Denies any pressure when sitting down. 12 lead unremarkable, sinus tach. BP-110/78 R-18

## 2012-08-17 NOTE — ED Notes (Signed)
EKG given to Dr. Denton Lank and copy placed in pt chart.

## 2012-08-18 LAB — URINE CULTURE

## 2012-08-18 LAB — T4, FREE: Free T4: 1.28 ng/dL (ref 0.80–1.80)

## 2012-08-21 ENCOUNTER — Encounter (INDEPENDENT_AMBULATORY_CARE_PROVIDER_SITE_OTHER): Payer: 59

## 2012-08-21 ENCOUNTER — Encounter: Payer: Self-pay | Admitting: Cardiovascular Disease

## 2012-08-21 ENCOUNTER — Ambulatory Visit (INDEPENDENT_AMBULATORY_CARE_PROVIDER_SITE_OTHER): Payer: 59 | Admitting: Cardiovascular Disease

## 2012-08-21 VITALS — BP 110/86 | HR 99 | Ht 63.0 in | Wt 173.0 lb

## 2012-08-21 DIAGNOSIS — R0600 Dyspnea, unspecified: Secondary | ICD-10-CM

## 2012-08-21 DIAGNOSIS — R002 Palpitations: Secondary | ICD-10-CM | POA: Insufficient documentation

## 2012-08-21 DIAGNOSIS — R0609 Other forms of dyspnea: Secondary | ICD-10-CM

## 2012-08-21 DIAGNOSIS — R000 Tachycardia, unspecified: Secondary | ICD-10-CM

## 2012-08-21 MED ORDER — METOPROLOL TARTRATE 25 MG PO TABS: 25.0000 mg | ORAL_TABLET | Freq: Two times a day (BID) | ORAL | Status: DC

## 2012-08-21 NOTE — Progress Notes (Signed)
Alexandra Rowe Date of Birth  [birth year]       [facility]    [place] 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 Palpitations 3.   History of Present Illness:  Alexandra Rowe is a 26 yo with hx of palpitations.  She had her left hip replaced and has episodes of significant tachycardia with any sort of exertion. She has not been able to exercise for the past 6 months. He she describes the tachycardia is a regular tachycardia.   These episodes are associated with chest tightness and shortness of breath.   The dyspnea and chest tightness resolved very quickly after her heart rate results. She had an episode while working with the physical therapist last week. She presented to the emergency room but the tachycardia had resolved by the time she reached the ER.  EKG at that time revealed sinus tachycardia.  Current Outpatient Prescriptions on File Prior to Visit  Medication Sig Dispense Refill  . benazepril (LOTENSIN) 40 MG tablet Take 40 mg by mouth daily.      . ciprofloxacin (CIPRO) 500 MG tablet Take 1 tablet (500 mg total) by mouth 2 (two) times daily.  10 tablet  0  . cyclobenzaprine (FLEXERIL) 10 MG tablet Take 10 mg by mouth 3 (three) times daily as needed. For spasms x 10 days. Started 08/14/12      . enoxaparin (LOVENOX) 40 MG/0.4ML injection Inject 40 mg into the skin daily.      Marland Kitchen HYDROmorphone (DILAUDID) 4 MG tablet Take 4-8 mg by mouth every 4 (four) hours as needed. For pain      . ondansetron (ZOFRAN) 4 MG tablet Take 4 mg by mouth every 8 (eight) hours as needed. For nausea      . senna (SENOKOT) 8.6 MG TABS Take 1 tablet by mouth 2 (two) times daily as needed. For constipation        Allergies  Allergen Reactions  . Oxycodone Nausea And  Vomiting    Itching    Past Medical History  Diagnosis Date  . Hypertension   . Obesity   . Cancer     Past Surgical History  Procedure Date  . None'   . Total hip arthroplasty     History  Smoking status  . Never Smoker   Smokeless tobacco  . Not on file    History  Alcohol Use  . Yes    Comment: socially    Family History  Problem Relation Age of Onset  . Hypertension    . Cancer      Reviw of Systems:  Reviewed in the HPI.  All other systems are negative.  Physical Exam: Blood pressure 110/86, pulse 99, height 5\' 3"  (1.6 m), weight 173 lb (78.472 kg), last menstrual period 08/08/2012, SpO2 96.00%. General: Well developed, well nourished, in no acute distress.  Head: Normocephalic, atraumatic, sclera non-icteric, mucus membranes are moist,   Neck: Supple. Carotids are 2 + without bruits. No JVD   Lungs: Clear   Heart: RR, normal S1, S2  Abdomen: Soft, non-tender, non-distended with normal bowel sounds.  Msk:  Strength and tone are normal   Extremities: No clubbing or cyanosis. No edema.  Distal  pedal pulses are 2+ and equal  Her left leg has had a hip replacement. She still wearing compression hose. She walks with the assistance of walker.  Neuro: CN II - XII intact.  Alert and oriented X 3.   Psych:  Normal   ECG: 08/17/2012: Normal sinus rhythm at 93. She has no ST or T wave changes.  Assessment / Plan:

## 2012-08-21 NOTE — Patient Instructions (Addendum)
Your physician has recommended that you wear an event monitor. Event monitors are medical devices that record the heart's electrical activity. Doctors most often [place] these monitors to diagnose arrhythmias. Arrhythmias are problems with the speed or rhythm of the heartbeat. The monitor is a small, portable device. You can wear one while you do your normal daily activities. This is usually used to diagnose what is causing palpitations/syncope (passing out).  Your physician has requested that you have an echocardiogram. Echocardiography is a painless test that uses sound waves to create images of your heart. It provides your doctor with information about the size and shape of your heart and how well your heart's chambers and valves are working. This procedure takes approximately one hour. There are no restrictions for this procedure.  Your physician has recommended you make the following change in your medication:  START METOPROLOL 25 MG TWICE DAILY, 12 HOURS APART  Your physician recommends that you schedule a follow-up appointment in: 3 MONTHS WITH DR NAHSER    YOU  ARE CLEARED FOR PT IF YOUR HEART RATE IS BELOW 150 BPM PER DR NAHSER

## 2012-08-21 NOTE — Assessment & Plan Note (Signed)
Alexandra Rowe  describes palpitations that occurred over the past year or so. She's had occasional episodes in the remote past.  These symptoms sound like sinus tachycardia but I cannot exclude supraventricular tachycardia. She thinks that the episodes are regular so I doubt she is having atrial fibrillation.  We will place a 30 day monitor on her. I've given her a prescription for metoprolol 25 mg twice a day. She's to start taking it after 2-3 days. I would  like to see if we can diagnose these arrhythmias before she starts the metoprolol.  Also get an echocardiogram on her. I'll see her again in the office in 3 months.

## 2012-08-21 NOTE — Progress Notes (Signed)
Patient ID: Alexandra Rowe, female   DOB: February 22, 1986, 26 y.o.   MRN: [id number] Placed a evnt monitor on patient 08/21/12 and I also went over instructions on how to use the monitor and when to return

## 2012-08-26 ENCOUNTER — Ambulatory Visit (HOSPITAL_COMMUNITY): Payer: 59 | Attending: Cardiology | Admitting: Radiology

## 2012-08-26 DIAGNOSIS — R0609 Other forms of dyspnea: Secondary | ICD-10-CM | POA: Insufficient documentation

## 2012-08-26 DIAGNOSIS — I10 Essential (primary) hypertension: Secondary | ICD-10-CM | POA: Insufficient documentation

## 2012-08-26 DIAGNOSIS — R002 Palpitations: Secondary | ICD-10-CM | POA: Insufficient documentation

## 2012-08-26 DIAGNOSIS — R0600 Dyspnea, unspecified: Secondary | ICD-10-CM

## 2012-08-26 DIAGNOSIS — R0989 Other specified symptoms and signs involving the circulatory and respiratory systems: Secondary | ICD-10-CM | POA: Insufficient documentation

## 2012-08-26 DIAGNOSIS — R0602 Shortness of breath: Secondary | ICD-10-CM

## 2012-08-26 NOTE — Progress Notes (Signed)
Echocardiogram performed.  

## 2012-08-28 ENCOUNTER — Telehealth: Payer: Self-pay | Admitting: Cardiovascular Disease

## 2012-08-28 NOTE — Telephone Encounter (Signed)
Pt would like her echo results called to her

## 2012-08-28 NOTE — Telephone Encounter (Signed)
Message copied by Antony Odea on Fri Aug 28, 2012  4:36 PM ------      Message from: Vesta Mixer      Created: Fri Aug 28, 2012  4:15 PM      Regarding: RE: echo       Echo looks normal            ----- Message -----         From: Antony Odea, RN         Sent: 08/28/2012  12:14 PM           To: Vesta Mixer, MD, Antony Odea, RN      Subject: echo                                                     Pt calling for echo results, you didn't note on the echo, please creat an addendum,

## 2012-08-28 NOTE — Telephone Encounter (Signed)
Pt requesting results of echo, explained Dr Elease Hashimoto has not signed results for release and I gave unofficial review of normal results and DR/Nurse will call if there are any recommendations, pt agreed to plan.

## 2012-09-25 ENCOUNTER — Ambulatory Visit: Payer: 59 | Attending: Orthopedic Surgery | Admitting: Physical Therapy

## 2012-09-25 DIAGNOSIS — Z96649 Presence of unspecified artificial hip joint: Secondary | ICD-10-CM | POA: Insufficient documentation

## 2012-09-25 DIAGNOSIS — IMO0001 Reserved for inherently not codable concepts without codable children: Secondary | ICD-10-CM | POA: Insufficient documentation

## 2012-09-25 DIAGNOSIS — M25559 Pain in unspecified hip: Secondary | ICD-10-CM | POA: Insufficient documentation

## 2012-09-25 DIAGNOSIS — M25669 Stiffness of unspecified knee, not elsewhere classified: Secondary | ICD-10-CM | POA: Insufficient documentation

## 2012-09-28 ENCOUNTER — Ambulatory Visit: Payer: 59 | Admitting: Physical Therapy

## 2012-09-28 ENCOUNTER — Telehealth: Payer: Self-pay | Admitting: *Deleted

## 2012-09-28 MED ORDER — METOPROLOL TARTRATE 25 MG PO TABS: 25.0000 mg | ORAL_TABLET | Freq: Three times a day (TID) | ORAL | Status: DC

## 2012-09-28 NOTE — Telephone Encounter (Signed)
Gave results of lifewatch event monitor, sinus tach, pt is to increase metoprolol to 25 mg TID, pt verbalized understanding, MAR adjusted, pt told to call with further questions and agreed to plan.

## 2012-09-29 ENCOUNTER — Ambulatory Visit: Payer: 59

## 2012-09-30 ENCOUNTER — Ambulatory Visit: Payer: 59

## 2012-10-01 ENCOUNTER — Encounter: Payer: 59 | Admitting: Physical Therapy

## 2012-10-05 ENCOUNTER — Encounter: Payer: 59 | Admitting: Physical Therapy

## 2012-10-06 ENCOUNTER — Ambulatory Visit: Payer: 59

## 2012-10-07 ENCOUNTER — Ambulatory Visit: Payer: 59

## 2012-10-08 ENCOUNTER — Ambulatory Visit: Payer: 59

## 2012-10-09 ENCOUNTER — Telehealth: Payer: Self-pay | Admitting: Cardiovascular Disease

## 2012-10-09 NOTE — Telephone Encounter (Signed)
Medication reviewed, side effects warning of possible fast pounding heart rate, told pt not to take till further advised, pt wants to know if there is another weight loss medication she could take, pt aware Dr out of office till next week and will wait to hear advice.

## 2012-10-09 NOTE — Telephone Encounter (Signed)
New problem:   Patient wants to know can she take this phentermine  37.5 mg due to 20 lbs weight gain.  Patient states she has taken this before.

## 2012-10-12 ENCOUNTER — Encounter: Payer: 59 | Admitting: Physical Therapy

## 2012-10-12 NOTE — Telephone Encounter (Signed)
I do not recommend taking Phentermine - especially if someone has episodes of tachycardia

## 2012-10-13 ENCOUNTER — Ambulatory Visit: Payer: 59 | Attending: Orthopedic Surgery

## 2012-10-13 DIAGNOSIS — IMO0001 Reserved for inherently not codable concepts without codable children: Secondary | ICD-10-CM | POA: Insufficient documentation

## 2012-10-13 DIAGNOSIS — M25669 Stiffness of unspecified knee, not elsewhere classified: Secondary | ICD-10-CM | POA: Insufficient documentation

## 2012-10-13 DIAGNOSIS — M25559 Pain in unspecified hip: Secondary | ICD-10-CM | POA: Insufficient documentation

## 2012-10-13 DIAGNOSIS — Z96649 Presence of unspecified artificial hip joint: Secondary | ICD-10-CM | POA: Insufficient documentation

## 2012-10-13 NOTE — Telephone Encounter (Signed)
msg left, no phentermine and to f/u with pcp for diet aid suggestions.

## 2012-10-14 ENCOUNTER — Ambulatory Visit: Payer: 59 | Admitting: Physical Therapy

## 2012-10-15 ENCOUNTER — Ambulatory Visit: Payer: 59

## 2012-10-20 ENCOUNTER — Ambulatory Visit: Payer: 59

## 2012-11-20 ENCOUNTER — Ambulatory Visit (INDEPENDENT_AMBULATORY_CARE_PROVIDER_SITE_OTHER): Payer: 59 | Admitting: Cardiovascular Disease

## 2012-11-20 ENCOUNTER — Encounter: Payer: Self-pay | Admitting: Cardiovascular Disease

## 2012-11-20 VITALS — BP 120/92 | HR 71 | Ht 63.0 in | Wt 187.8 lb

## 2012-11-20 DIAGNOSIS — R002 Palpitations: Secondary | ICD-10-CM

## 2012-11-20 DIAGNOSIS — I10 Essential (primary) hypertension: Secondary | ICD-10-CM | POA: Insufficient documentation

## 2012-11-20 MED ORDER — METOPROLOL SUCCINATE ER 50 MG PO TB24: 50.0000 mg | ORAL_TABLET | Freq: Every day | ORAL | Status: DC

## 2012-11-20 NOTE — Patient Instructions (Addendum)
Your physician wants you to follow-up in: 3 months  You will receive a reminder letter in the mail two months in advance. If you don't receive a letter, please call our office to schedule the follow-up appointment.   Your physician has recommended you make the following change in your medication:   Stop metoprolol Start toprol xl 50 mg daily

## 2012-11-20 NOTE — Assessment & Plan Note (Signed)
She was previously on benazepril. We may need to start a low dose of ACE inhibitor back once we have her heart rate regulated.

## 2012-11-20 NOTE — Assessment & Plan Note (Signed)
Alexandra Rowe seems to be doing better on metoprolol but she feels like her metabolism a slow. She complains of the fatigue associated with metoprolol. This may also be due to the fact that she had hip replacement recently.  We will discontinue the metoprolol and start her on Toprol-XL 50 mg a day.  We'll see if she tolerates this better. Alternatively, we can try her on carvedilol or perhaps labetalol.  I will see her again in 3 months for followup visit.

## 2012-11-20 NOTE — Progress Notes (Signed)
Alexandra Rowe Date of Birth  04/12/1986       [facility]    [place] [address]  [address] [place], [place]  [address]   [place], [place]  [address] [phone]     [phone]   Fax  [phone]    Fax [phone]  Problem List: 1. 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 Palpitations 3.   History of Present Illness:  Alexandra Rowe is a 27 yo with hx of palpitations.  She had her left hip replaced and has episodes of significant tachycardia with any sort of exertion. She has not been able to exercise for the past 6 months. He she describes the tachycardia is a regular tachycardia.   These episodes are associated with chest tightness and shortness of breath.   The dyspnea and chest tightness resolved very quickly after her heart rate results. She had an episode while working with the physical therapist last week. She presented to the emergency room but the tachycardia had resolved by the time she reached the ER.  EKG at that time revealed sinus tachycardia.  November 20, 2012:  Since I last saw her, she has done well.  We started her on Metoprolol. Her echo was normal.    She has gained 25 lbs since her hip surgery.     Current Outpatient Prescriptions on File Prior to Visit  Medication Sig Dispense Refill  . Acetaminophen (TYLENOL PO) Take 1,000 mg by mouth 2 (two) times daily.      Marland Kitchen senna (SENOKOT) 8.6 MG TABS Take 1 tablet by mouth 2 (two) times daily as needed. For constipation       No current facility-administered medications on file prior to visit.    Allergies  Allergen Reactions  . Oxycodone Nausea And Vomiting    Itching    Past Medical History  Diagnosis Date  . Hypertension   . Obesity   . Cancer     Past Surgical History  Procedure Laterality Date  . None'    . Total hip arthroplasty      History  Smoking status  . Never Smoker   Smokeless tobacco  . Not on file     History  Alcohol Use  . Yes    Comment: socially    Family History  Problem Relation Age of Onset  . Hypertension    . Cancer      Reviw of Systems:  Reviewed in the HPI.  All other systems are negative.  Physical Exam: Blood pressure 120/92, pulse 71, height 5\' 3"  (1.6 m), weight 187 lb 12.8 oz (85.186 kg), SpO2 97.00%. General: Well developed, well nourished, in no acute distress.  Head: Normocephalic, atraumatic, sclera non-icteric, mucus membranes are moist,   Neck: Supple. Carotids are 2 + without bruits. No JVD   Lungs: Clear   Heart: RR, normal S1, S2  Abdomen: Soft, non-tender, non-distended with normal bowel sounds.  Msk:  Strength and tone are normal   Extremities: No clubbing or cyanosis. No edema.  Distal pedal pulses are 2+ and equal  Her left leg has had a hip replacement. She still wearing compression hose. She walks with the assistance of walker.  Neuro: CN II - XII intact.  Alert and oriented X 3.   Psych:  Normal   ECG: 08/17/2012: Normal sinus rhythm at 93. She has no ST or T wave changes.  Assessment / Plan:

## 2013-02-15 ENCOUNTER — Ambulatory Visit: Payer: 59 | Admitting: Cardiovascular Disease

## 2013-04-08 ENCOUNTER — Encounter: Payer: Self-pay | Admitting: Cardiovascular Disease

## 2013-04-08 ENCOUNTER — Ambulatory Visit (INDEPENDENT_AMBULATORY_CARE_PROVIDER_SITE_OTHER): Payer: 59 | Admitting: Cardiovascular Disease

## 2013-04-08 VITALS — BP 130/82 | HR 61 | Ht 63.0 in | Wt 185.0 lb

## 2013-04-08 DIAGNOSIS — I10 Essential (primary) hypertension: Secondary | ICD-10-CM

## 2013-04-08 NOTE — Patient Instructions (Addendum)
Your physician wants you to follow-up in: 1 year  You will receive a reminder letter in the mail two months in advance. If you don't receive a letter, please call our office to schedule the follow-up appointment.  Your physician recommends that you continue on your current medications as directed. Please refer to the Current Medication list given to you today.  

## 2013-04-08 NOTE — Assessment & Plan Note (Signed)
Her BP is well controlled.  Continue current meds.  I have advised her to stick to a low salt diet and work on a good exercise program ( as tolerated)  .  I will see her again in 1 year.

## 2013-04-08 NOTE — Progress Notes (Signed)
Alexandra Rowe Date of Birth  11-Jul-1986       [facility]    [place] [address]  [address] [place], [place]  [address]   [place], [place]  [address] [phone]     [phone]   Fax  [phone]    Fax [phone]  Problem List: 1. 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 Palpitations 3. Hypertension  History of Present Illness:  Alexandra Rowe is a 27 yo with hx of palpitations.  She had her left hip replaced and has episodes of significant tachycardia with any sort of exertion. She has not been able to exercise for the past 6 months. He she describes the tachycardia is a regular tachycardia.   These episodes are associated with chest tightness and shortness of breath.   The dyspnea and chest tightness resolved very quickly after her heart rate results. She had an episode while working with the physical therapist last week. She presented to the emergency room but the tachycardia had resolved by the time she reached the ER.  EKG at that time revealed sinus tachycardia.  November 20, 2012:  Since I last saw her, she has done well.  We started her on Metoprolol. Her echo was normal.    She has gained 25 lbs since her hip surgery.    July 31 , 2014:  Alexandra Rowe is doing well.  She has not had any problems with the toprol.  Her BP has been fairly well controlled.   She is exercising some but not as much as she would like due to her hip surgery.     Current Outpatient Prescriptions on File Prior to Visit  Medication Sig Dispense Refill  . metoprolol succinate (TOPROL XL) 50 MG 24 hr tablet Take 1 tablet (50 mg total) by mouth daily. Take with or immediately following a meal.  90 tablet  1   No current facility-administered medications on file prior to visit.    Allergies  Allergen Reactions  . Oxycodone Nausea And Vomiting    Itching    Past Medical History  Diagnosis Date  . Hypertension   . Obesity   .  Cancer     Past Surgical History  Procedure Laterality Date  . None'    . Total hip arthroplasty      History  Smoking status  . Never Smoker   Smokeless tobacco  . Not on file    History  Alcohol Use  . Yes    Comment: socially    Family History  Problem Relation Age of Onset  . Hypertension    . Cancer      Reviw of Systems:  Reviewed in the HPI.  All other systems are negative.  Physical Exam: Blood pressure 130/82, pulse 61, height 5\' 3"  (1.6 m), weight 185 lb (83.915 kg). General: Well developed, well nourished, in no acute distress.  Head: Normocephalic, atraumatic, sclera non-icteric, mucus membranes are moist,   Neck: Supple. Carotids are 2 + without bruits. No JVD   Lungs: Clear   Heart: RR, normal S1, S2  Abdomen: Soft, non-tender, non-distended with normal bowel sounds.  Msk:  Strength and tone are normal   Extremities: No clubbing or cyanosis. No edema.  Distal pedal pulses are 2+ and equal  Her left leg has had a hip replacement.   Neuro: CN II - XII intact.  Alert and oriented X 3.   Psych:  Normal   ECG: April 08, 2013:  NSR at 37. Normal ECG.  HR is slower since previous tracing  Assessment / Plan:

## 2013-08-06 ENCOUNTER — Emergency Department (INDEPENDENT_AMBULATORY_CARE_PROVIDER_SITE_OTHER): Payer: 59

## 2013-08-06 ENCOUNTER — Encounter (HOSPITAL_COMMUNITY): Payer: Self-pay | Admitting: Emergency Medicine

## 2013-08-06 ENCOUNTER — Emergency Department (HOSPITAL_COMMUNITY)
Admission: EM | Admit: 2013-08-06 | Discharge: 2013-08-06 | Disposition: A | Discharge status: Home / Self Care | Payer: 59 | Source: Home / Self Care | Attending: Emergency Medicine | Admitting: Emergency Medicine

## 2013-08-06 DIAGNOSIS — J111 Influenza due to unidentified influenza virus with other respiratory manifestations: Secondary | ICD-10-CM

## 2013-08-06 DIAGNOSIS — M25559 Pain in unspecified hip: Secondary | ICD-10-CM

## 2013-08-06 DIAGNOSIS — B009 Herpesviral infection, unspecified: Secondary | ICD-10-CM

## 2013-08-06 DIAGNOSIS — B001 Herpesviral vesicular dermatitis: Secondary | ICD-10-CM

## 2013-08-06 DIAGNOSIS — M25552 Pain in left hip: Secondary | ICD-10-CM

## 2013-08-06 HISTORY — DX: Malignant neoplasm of bone and articular cartilage, unspecified: C41.9

## 2013-08-06 MED ORDER — BENZONATATE 200 MG PO CAPS: 200.0000 mg | ORAL_CAPSULE | Freq: Three times a day (TID) | ORAL | Status: DC | PRN

## 2013-08-06 MED ORDER — ACETAMINOPHEN 500 MG PO TABS
1000.0000 mg | ORAL_TABLET | Freq: Once | ORAL | Status: AC
  Administered 2013-08-06: 1000 mg via ORAL

## 2013-08-06 MED ORDER — VALACYCLOVIR HCL 1 G PO TABS: ORAL_TABLET | ORAL | Status: DC

## 2013-08-06 MED ORDER — OSELTAMIVIR PHOSPHATE 75 MG PO CAPS: 75.0000 mg | ORAL_CAPSULE | Freq: Two times a day (BID) | ORAL | Status: DC

## 2013-08-06 MED ORDER — ACETAMINOPHEN 325 MG PO TABS
ORAL_TABLET | ORAL | Status: AC
  Filled 2013-08-06: qty 3

## 2013-08-06 NOTE — ED Notes (Signed)
C/o cough, fever w chills, body aches, lip lesions

## 2013-08-06 NOTE — ED Notes (Signed)
Spoke with Radiology.  Left hip images from today pushed to [facility]

## 2013-08-06 NOTE — ED Provider Notes (Signed)
Chief Complaint:   Chief Complaint  Patient presents with  . 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 She had a cough productive of thick, tan sputum, and burning in her chest. She's had nasal congestion, rhinorrhea, and sore throat. She also notes frequent episodes of fever blisters on her lips. She tried over-the-counter ointments without any improvement. The patient also is concerned about some aching in her left hip. She underwent a total hip replacement year ago at [facility] for chondrosarcoma. This was uneventful, over the past several weeks she's had slight aching in the hip. She has had the flu vaccine this year.  Review of Systems:  Other than noted above, the patient denies any of the following symptoms: Systemic:  No fevers, chills, sweats, weight loss or gain, fatigue, or tiredness. Eye:  No redness or discharge. ENT:  No ear pain, drainage, headache, nasal congestion, drainage, sinus pressure, difficulty swallowing, or sore throat. Neck:  No neck pain or swollen glands. Lungs:  No cough, sputum production, hemoptysis, wheezing, chest tightness, shortness of breath or chest pain. GI:  No abdominal pain, nausea, vomiting or diarrhea.  PMFSH:  Past medical history, family history, social history, meds, and allergies were reviewed. She is allergic to hydrocodone. She takes Cymbalta metoprolol. She also has high blood pressure.  Physical Exam:   Vital signs:  BP 119/82  Pulse 108  Temp(Src) 102.1 F (38.9 C) (Oral)  Resp 18  SpO2 99%  LMP 08/03/2013 General:  Alert and oriented.  In no distress.  Skin warm and dry. Eye:  No conjunctival injection or drainage. Lids were normal. ENT:  TMs and canals were normal, without erythema or inflammation.  Nasal mucosa was clear and uncongested, without drainage.  Mucous membranes  were moist.  Pharynx was clear with no exudate or drainage.  There were no oral ulcerations or lesions. Neck:  Supple, no adenopathy, tenderness or mass. Lungs:  No respiratory distress.  Lungs were clear to auscultation, without wheezes, rales or rhonchi.  Breath sounds were clear and equal bilaterally.  Heart:  Regular rhythm, without gallops, murmers or rubs. Skin:  Clear, warm, and dry, without rash or lesions.  Radiology:  Dg Chest 2 View  08/06/2013   CLINICAL DATA:  Cough, fever, and left hip pain for 3 days, history of chondrosarcoma left hip, hypertension  EXAM: CHEST  2 VIEW  COMPARISON:  08/17/2012  FINDINGS: Upper-normal size of cardiac silhouette.  Mediastinal contours and pulmonary vascularity normal.  Minimal chronic peribronchial thickening.  Lungs otherwise clear.  No pleural effusion or pneumothorax.  Bones unremarkable.  IMPRESSION: No acute abnormalities.   Electronically Signed   By: Ulyses Southward M.D.   On: 08/06/2013 14:18   Dg Hip Complete Left  08/06/2013   CLINICAL DATA:  Cough, fever and left hip pain. History of left hip chondrosarcoma status post total arthroplasty 1 year ago.  EXAM: LEFT HIP - COMPLETE 2+ VIEW  COMPARISON:  Preoperative radiographs of the pelvis and left hip 06/30/2012; MRI left hip 07/10/2012  FINDINGS: Surgical changes of a left total hip arthroplasty. Sharp and smoothly defined lucency noted in the acetabulum about the acetabular cup and anchoring screws as well as within the greater and lesser trochanters of the proximal femur about the femoral stem component. Additionally, there is a very mild smooth cortical thickening along the  medial aspect of the proximal femoral diaphysis adjacent to the distal tip of the femoral stem. The remainder the visualized pelvis and right hip are unremarkable. An IUD projects over the anatomic pelvis. Transitional anatomy with sacralization on the left at L5.  IMPRESSION: 1. Nonspecific well defined lucency/osteopenia about  the acetabular cup and anchoring screws as well as within the greater and lesser trochanters of the proximal femur surrounding the proximal femoral stem component. Differential considerations are broad and include reactive osteopenia, particle disease and less likely infection. Recommend correlation with outside postoperative radiographs to evaluate for stability of the findings. 2. Mild cortical thickening in the medial femoral diaphysis adjacent to the distal aspect of the femoral stem likely representing stress reaction. 3. IUD projects over the anatomic pelvis.   Electronically Signed   By: Malachy Moan M.D.   On: 08/06/2013 14:27   Assessment:  The primary encounter diagnosis was Influenza-like illness. Diagnoses of Left hip pain and Herpes labialis were also pertinent to this visit.  She's had the flu vaccine, but she does have a flulike illness. This may either be vaccine failure, a strain not in the vaccine cough, or another virus that is mimicking the flu. She was treated with Tamiflu. I suggested that she followup with her orthopedic oncologist for the hip pain. I was not overly impressed with the radiographic findings, and do not think that this represents a recurrence of the cancer or infection. Likewise, she doesn't have any pain with ambulation, so I don't think a stress injury or stress fracture is very likely.  Plan:   1.  Meds:  The following meds were prescribed:   Discharge Medication List as of 08/06/2013  2:56 PM    START taking these medications   Details  benzonatate (TESSALON) 200 MG capsule Take 1 capsule (200 mg total) by mouth 3 (three) times daily as needed for cough., Starting 08/06/2013, Until Discontinued, Normal    oseltamivir (TAMIFLU) 75 MG capsule Take 1 capsule (75 mg total) by mouth every 12 (twelve) hours., Starting 08/06/2013, Until Discontinued, Normal    valACYclovir (VALTREX) 1000 MG tablet 1/2 tablet BID for 1 week., Normal        2.  Patient  Education/Counseling:  The patient was given appropriate handouts, self care instructions, and instructed in symptomatic relief.   3.  Follow up:  The patient was told to follow up if no better in 3 to 4 days, if becoming worse in any way, and given some red flag symptoms such as worsening hip pain, difficulty breathing, or increasing fever which would prompt immediate return.  Follow up here for any respiratory problems and with her orthopedic oncologist for the hip issues, she or he has not appointment there in a few weeks.      Reuben Likes, MD 08/06/13 [id number]

## 2013-08-06 NOTE — ED Notes (Signed)
Attempting to have images from [facility] sent electronically

## 2013-08-16 ENCOUNTER — Other Ambulatory Visit: Payer: Self-pay | Admitting: Cardiovascular Disease

## 2014-02-24 ENCOUNTER — Other Ambulatory Visit: Payer: Self-pay | Admitting: Cardiovascular Disease

## 2014-03-03 ENCOUNTER — Other Ambulatory Visit: Payer: Self-pay | Admitting: Gastroenterology

## 2014-03-03 DIAGNOSIS — R1011 Right upper quadrant pain: Secondary | ICD-10-CM

## 2014-03-15 ENCOUNTER — Ambulatory Visit (HOSPITAL_COMMUNITY)
Admission: RE | Admit: 2014-03-15 | Discharge: 2014-03-15 | Disposition: A | Discharge status: Home / Self Care | Payer: 59 | Source: Ambulatory Visit | Attending: Gastroenterology | Admitting: Gastroenterology

## 2014-03-15 ENCOUNTER — Encounter (HOSPITAL_COMMUNITY)
Admission: RE | Admit: 2014-03-15 | Discharge: 2014-03-15 | Disposition: A | Discharge status: Home / Self Care | Payer: 59 | Source: Ambulatory Visit | Attending: Diagnostic Radiology | Admitting: Diagnostic Radiology

## 2014-03-15 DIAGNOSIS — R198 Other specified symptoms and signs involving the digestive system and abdomen: Secondary | ICD-10-CM | POA: Diagnosis not present

## 2014-03-15 DIAGNOSIS — I10 Essential (primary) hypertension: Secondary | ICD-10-CM | POA: Insufficient documentation

## 2014-03-15 DIAGNOSIS — R1011 Right upper quadrant pain: Secondary | ICD-10-CM | POA: Insufficient documentation

## 2014-03-15 MED ORDER — TECHNETIUM TC 99M MEBROFENIN IV KIT: 5.0000 | PACK | Freq: Once | INTRAVENOUS | Status: AC | PRN

## 2014-03-15 MED ORDER — TECHNETIUM TC 99M MEBROFENIN IV KIT
5.0000 | PACK | Freq: Once | INTRAVENOUS | Status: AC | PRN
  Administered 2014-03-15: 5 via INTRAVENOUS

## 2014-03-15 MED ORDER — SINCALIDE 5 MCG IJ SOLR
INTRAMUSCULAR | Status: AC
  Administered 2014-03-15: 11:00:00
  Filled 2014-03-15: qty 5

## 2014-03-16 ENCOUNTER — Telehealth: Payer: Self-pay | Admitting: Cardiovascular Disease

## 2014-03-16 NOTE — Telephone Encounter (Addendum)
°  Patient has procedure scheduled for tomorrow to have her wisdom teeth removed. She had a procedure yesterday by Dr. Collene Mares that showed she had Pericardial perfusion, she is now scared to be put to sleep. Please call and advise.

## 2014-03-16 NOTE — Telephone Encounter (Signed)
She is at low risk to have her wisdom teeth pulled.   I do not have any information on her pericardial effusion but would be glad to see her for further eval if needed.

## 2014-03-16 NOTE — Telephone Encounter (Signed)
Pt calling to ask Dr Acie Fredrickson and nurse if she should proceed with her wisdom teeth extraction that is scheduled for 7/9, as she was recently diagnosed by her GI Doctor with having a very small pericardial effusion.  Pt states she contacted her PCP about these findings, and they told her it would be safe to proceed with the scheduled procedure.  Pt still would like reassurance from a cardiac standpoint, that its safe to have this done.  Informed pt Dr Acie Fredrickson and nurse are both out of the office today, but I will route this message to them for further review and recommendation.  Also advised pt that she should notify her oral surgeon as well, and make him aware of recent diagnosis of pericardial effusion.  Pt verbalized understanding and agrees with this plan.  Will route this message to Dr Acie Fredrickson.

## 2014-03-16 NOTE — Telephone Encounter (Signed)
F/u   Pt having sx tomorrow and need to speak to nurse today.

## 2014-03-16 NOTE — Telephone Encounter (Signed)
Called pt to inform her that per Dr Acie Fredrickson she is at low risk to have her wisdom teeth pulled, and also that Dr Acie Fredrickson did not have any information on her pericardial effusion, but would be glad to see her for further eval.  Pt verbalized understanding and very gracious for all the assistance provided.  Pt states after she has her wisdom teeth pulled and recovers, then she will be contacting our office to call and schedule a f/u appt with Dr Acie Fredrickson.

## 2014-04-13 ENCOUNTER — Encounter: Payer: Self-pay | Admitting: Physician Assistant

## 2014-04-13 ENCOUNTER — Ambulatory Visit (INDEPENDENT_AMBULATORY_CARE_PROVIDER_SITE_OTHER): Payer: 59 | Admitting: Physician Assistant

## 2014-04-13 VITALS — BP 120/80 | HR 66 | Ht 63.0 in | Wt 194.0 lb

## 2014-04-13 DIAGNOSIS — R002 Palpitations: Secondary | ICD-10-CM

## 2014-04-13 DIAGNOSIS — I319 Disease of pericardium, unspecified: Secondary | ICD-10-CM

## 2014-04-13 DIAGNOSIS — I3139 Other pericardial effusion (noninflammatory): Secondary | ICD-10-CM

## 2014-04-13 DIAGNOSIS — I10 Essential (primary) hypertension: Secondary | ICD-10-CM

## 2014-04-13 DIAGNOSIS — I313 Pericardial effusion (noninflammatory): Secondary | ICD-10-CM

## 2014-04-13 NOTE — Patient Instructions (Addendum)
Your symptoms sound like you had pericarditis that has resolved.    If you have recurrent symptoms, call for earlier follow up.  We will check an echocardiogram to make sure everything is ok.  Your physician has requested that you have an echocardiogram. Echocardiography is a painless test that uses sound waves to create images of your heart. It provides your doctor with information about the size and shape of your heart and how well your heart's chambers and valves are working. This procedure takes approximately one hour. There are no restrictions for this procedure.  Your physician wants you to follow-up in: 1 year with Dr.Nahser You will receive a reminder letter in the mail two months in advance. If you don't receive a letter, please call our office to schedule the follow-up appointment.

## 2014-04-13 NOTE — Progress Notes (Signed)
Cardiology Office Note    Date:  04/13/2014   ID:  Leona, Alexandra Rowe, Alexandra Rowe, Alexandra Rowe  PCP:  Chesley Noon, MD  Cardiologist:  Dr. Liam Rogers      History of Present Illness: Alexandra Rowe is a 28 y.o. 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  Event monitor in the past has demonstrated sinus tachycardia. There was symptomatic improvement with beta blocker therapy.  Last seen by Dr. Acie Fredrickson 7/14.  She was recently seen by gastroenterology. An abdominal ultrasound demonstrated possible pericardial effusion. Around that same time, she had been experiencing some dyspnea with lying flat as well as chest tightness. She also had her wisdom teeth extracted. She took high doses of ibuprofen around this time. Her breathing and chest symptoms resolved. Currently, she feels well. She denies exertional chest pain, shortness of breath, syncope, orthopnea, PND or edema.   Studies:  - Echo (12/13):  EF 55%, normal wall motion  - Event monitor (12/13): NSR, sinus tachycardia  Recent Labs/Images: No results found for requested labs within last 365 days.  US Abdomen Complete  03/15/2014    Other findings:  Trace pericardial effusion.  IMPRESSION: 1. No cholelithiasis or sonographic evidence of acute cholecystitis.   Electronically Signed   By: Kathreen Devoid   On: 03/15/2014 09:08     Wt Readings from Last 3 Encounters:  04/13/14 194 lb (87.998 kg)  04/08/13 185 lb (83.915 kg)  11/20/12 187 lb 12.8 oz (85.186 kg)     Past Medical History  Diagnosis Date  . Hypertension   . Obesity   . Cancer   . Clear cell chondrosarcoma     Current Outpatient Prescriptions  Medication Sig Dispense Refill  . metoprolol succinate (TOPROL-XL) 50 MG 24 hr tablet TAKE ONE TABLET BY MOUTH ONCE DAILY. TAKE WITH OR IMMEDIATELY FOLLOWING A MEAL.  90 tablet  0   No current facility-administered medications for this  visit.     Allergies:   Oxycodone   Social History:  The patient  reports that she has never smoked. She does not have any smokeless tobacco history on file. She reports that she drinks alcohol.   Family History:  The patient's family history includes Cancer in an other family member; Heart attack in her maternal grandfather; Hypertension in her father and another family member.   ROS:  Please see the history of present illness.      All other systems reviewed and negative.   PHYSICAL EXAM: VS:  BP 120/80  Pulse 66  Ht 5\' 3"  (1.6 m)  Wt 194 lb (87.998 kg)  BMI 34.37 kg/m2 Well nourished, well developed, in no acute distress HEENT: normal Neck: no JVD Cardiac:  normal S1, S2; RRR; no murmur Lungs:  clear to auscultation bilaterally, no wheezing, rhonchi or rales Abd: soft, nontender, no hepatomegaly Ext: no edema Skin: warm and dry Neuro:  CNs 2-12 intact, no focal abnormalities noted  EKG:  NSR, HR 66, normal axis, no ST changes     ASSESSMENT AND PLAN:  Pericarditis:  I suspect she had a low level episode of pericarditis given her symptoms and trace pericardial effusion noted on abdominal ultrasound. She took high doses of ibuprofen around that time. This likely treated her condition. I will obtain an echocardiogram to rule out significant effusion. She knows to contact us if she has recurrent symptoms. If she has recurrent symptoms, she will  likely need colchicine x3 months.  Pericardial effusion:  Obtain echocardiogram as noted.  Palpitations:  Controlled on current therapy.  Essential hypertension:  Controlled on current therapy.    Disposition:  Follow up with Dr. Acie Fredrickson in one year.   Signed, Versie Starks, MHS 04/13/2014 Rowe:20 AM    [facility] [place], [place], [place]  [address] Phone: [phone]; Fax: [phone]

## 2014-04-21 ENCOUNTER — Ambulatory Visit (HOSPITAL_COMMUNITY): Payer: 59 | Attending: Cardiovascular Disease

## 2014-04-21 DIAGNOSIS — I3139 Other pericardial effusion (noninflammatory): Secondary | ICD-10-CM

## 2014-04-21 DIAGNOSIS — I319 Disease of pericardium, unspecified: Secondary | ICD-10-CM | POA: Insufficient documentation

## 2014-04-21 DIAGNOSIS — I517 Cardiomegaly: Secondary | ICD-10-CM | POA: Insufficient documentation

## 2014-04-21 DIAGNOSIS — I079 Rheumatic tricuspid valve disease, unspecified: Secondary | ICD-10-CM | POA: Insufficient documentation

## 2014-04-21 DIAGNOSIS — I10 Essential (primary) hypertension: Secondary | ICD-10-CM | POA: Insufficient documentation

## 2014-04-21 DIAGNOSIS — I059 Rheumatic mitral valve disease, unspecified: Secondary | ICD-10-CM | POA: Insufficient documentation

## 2014-04-21 DIAGNOSIS — C419 Malignant neoplasm of bone and articular cartilage, unspecified: Secondary | ICD-10-CM | POA: Diagnosis not present

## 2014-04-21 DIAGNOSIS — I313 Pericardial effusion (noninflammatory): Secondary | ICD-10-CM

## 2014-04-21 NOTE — Progress Notes (Signed)
2D Echo completed. 04/21/2014 

## 2014-04-22 ENCOUNTER — Telehealth: Payer: Self-pay | Admitting: *Deleted

## 2014-04-22 ENCOUNTER — Encounter: Payer: Self-pay | Admitting: Physician Assistant

## 2014-04-22 NOTE — Telephone Encounter (Signed)
pt notified about echo results with verbal understanding 

## 2014-05-31 ENCOUNTER — Other Ambulatory Visit: Payer: Self-pay | Admitting: Cardiovascular Disease

## 2015-07-14 ENCOUNTER — Other Ambulatory Visit: Payer: Self-pay | Admitting: Cardiovascular Disease

## 2015-09-20 DIAGNOSIS — F900 Attention-deficit hyperactivity disorder, predominantly inattentive type: Secondary | ICD-10-CM | POA: Diagnosis not present

## 2015-09-20 DIAGNOSIS — F331 Major depressive disorder, recurrent, moderate: Secondary | ICD-10-CM | POA: Diagnosis not present

## 2015-09-20 DIAGNOSIS — F419 Anxiety disorder, unspecified: Secondary | ICD-10-CM | POA: Diagnosis not present

## 2015-10-09 DIAGNOSIS — H527 Unspecified disorder of refraction: Secondary | ICD-10-CM | POA: Diagnosis not present

## 2015-10-09 MED FILL — LINZESS 290 MCG CAPSULE: 290 | 30 days supply | Qty: 30 | Fill #0

## 2015-10-11 MED FILL — STRATTERA 80 MG CAPSULE: 80 | 30 days supply | Qty: 30 | Fill #0

## 2015-10-27 MED FILL — METOPROLOL SUCC ER 50 MG TA: 50 | 90 days supply | Qty: 90 | Fill #0

## 2015-11-15 MED FILL — STRATTERA 80 MG CAPSULE: 80 | 30 days supply | Qty: 30 | Fill #1

## 2015-11-15 MED FILL — BUPROPION HCL XL 150 MG TAB: 150 | 30 days supply | Qty: 30 | Fill #1

## 2015-12-21 MED FILL — BUPROPION HCL XL 150 MG TAB: 150 | 30 days supply | Qty: 30 | Fill #2

## 2015-12-21 MED FILL — STRATTERA 80 MG CAPSULE: 80 | 30 days supply | Qty: 30 | Fill #2

## 2016-01-17 MED FILL — METOPROLOL SUCC ER 50 MG TA: 50 | 90 days supply | Qty: 90 | Fill #1

## 2016-01-17 MED FILL — BUPROPION HCL XL 150 MG TAB: 150 | 30 days supply | Qty: 30 | Fill #3

## 2016-01-17 MED FILL — STRATTERA 80 MG CAPSULE: 80 | 30 days supply | Qty: 30 | Fill #3

## 2016-01-19 MED FILL — CEPHALEXIN 500 MG CAPSULE: 500 | 5 days supply | Qty: 15 | Fill #0

## 2016-01-19 MED FILL — PROMETHAZINE 12.5 MG TABLET: 12.5 | 3 days supply | Qty: 10 | Fill #0

## 2016-01-19 MED FILL — metroNIDAZOLE 500 MG TABS: 500 | 7 days supply | Qty: 14 | Fill #0

## 2016-01-30 MED FILL — HYDROCODON-APAP 5-325: 5-325 | 7 days supply | Qty: 40 | Fill #0

## 2016-02-14 MED FILL — OXYCODONE/APAP 5-325: 5-325 | 3 days supply | Qty: 40 | Fill #0

## 2016-02-22 MED FILL — STRATTERA 80 MG CAPSULE: 80 | 30 days supply | Qty: 30 | Fill #4

## 2016-02-22 MED FILL — BUPROPION HCL XL 150 MG TAB: 150 | 90 days supply | Qty: 90 | Fill #0

## 2016-03-21 MED FILL — ATOMOXETINE HCL 80 MG CAP: 80 | 30 days supply | Qty: 30 | Fill #5

## 2016-04-23 ENCOUNTER — Encounter (HOSPITAL_COMMUNITY): Payer: Self-pay | Admitting: *Deleted

## 2016-04-23 ENCOUNTER — Ambulatory Visit (HOSPITAL_COMMUNITY)
Admission: EM | Admit: 2016-04-23 | Discharge: 2016-04-23 | Disposition: A | Discharge status: Home / Self Care | Payer: 59

## 2016-04-23 DIAGNOSIS — K5900 Constipation, unspecified: Secondary | ICD-10-CM

## 2016-04-23 MED ORDER — PEG 3350-KCL-NA BICARB-NACL 420 G PO SOLR: 4000.0000 mL | Freq: Once | ORAL | 0 refills | Status: AC

## 2016-04-23 NOTE — ED Triage Notes (Signed)
Pt   Has  Symptoms  Of  Constipation   With   Bloating  Sensation  In  abd  She  Has  Tried  Numerous  laxatives  As   Well   stool  softners    And  Still  Has   Symptoms  She reports  She  Has  An  appt  In  2  Days  With  Gastroenterologist

## 2016-04-23 NOTE — ED Provider Notes (Signed)
[facility]    CSN: [id number] Arrival date & time: 04/23/16  1648  First Provider Contact:  First MD Initiated Contact with Patient 04/23/16 1755        History   Chief Complaint Chief Complaint  Patient presents with  . Constipation    HPI Alexandra Rowe is a 30 y.o. female.    Constipation  Severity:  Moderate Time since last bowel movement:  3 weeks Progression:  Unchanged Chronicity:  Chronic Context comment:  H/o chronic const, up to 5 weeks , Stool description:  None produced Relieved by:  Nothing Ineffective treatments:  Enemas and laxatives Associated symptoms: nausea   Associated symptoms: no diarrhea, no fever and no vomiting   Risk factors comment:  S/p recent abdominalplasty.   Past Medical History:  Diagnosis Date  . Cancer ([place])   . Clear cell chondrosarcoma (HCC)   . Hx of echocardiogram    Echo (8/15):  EF 55-60%, no effusion  . Hypertension   . Obesity     Patient Active Problem List   Diagnosis Date Noted  . HTN (hypertension) 11/20/2012  . Palpitations 08/21/2012    Past Surgical History:  Procedure Laterality Date  . ABDOMINAL SURGERY    . none'    . TOTAL HIP ARTHROPLASTY      OB History    No data available       Home Medications    Prior to Admission medications   Medication Sig Start Date End Date Taking? Authorizing Provider  BuPROPion HCl (WELLBUTRIN PO) Take by mouth.   Yes Historical Provider, MD  Linaclotide (LINZESS PO) Take by mouth.   Yes Historical Provider, MD  METOPROLOL TARTRATE PO Take by mouth.   Yes Historical Provider, MD  metoprolol succinate (TOPROL-XL) 50 MG 24 hr tablet TAKE 1 TABLET BY MOUTH ONCE DAILY WITH OR IMMEDIATELY FOLLOWING A MEAL 07/14/15   Thayer Headings, MD  polyethylene glycol-electrolytes (NULYTELY/GOLYTELY) 420 g solution Take 4,000 mLs by mouth once. 04/23/16 04/23/16  Billy Fischer, MD    Family History Family History  Problem Relation Age of Onset  . Heart attack  Maternal Grandfather   . Hypertension    . Cancer    . Hypertension Father     Social History Social History  Substance Use Topics  . Smoking status: Never Smoker  . Smokeless tobacco: Not on file  . Alcohol use Yes     Comment: socially     Allergies   Oxycodone   Review of Systems Review of Systems  Constitutional: Negative for fever.  Gastrointestinal: Positive for constipation and nausea. Negative for blood in stool, diarrhea and vomiting.  All other systems reviewed and are negative.    Physical Exam Triage Vital Signs ED Triage Vitals  Enc Vitals Group     BP 04/23/16 1700 109/66     Pulse Rate 04/23/16 1700 73     Resp 04/23/16 1700 16     Temp 04/23/16 1700 98.2 F (36.8 C)     Temp Source 04/23/16 1700 Oral     SpO2 04/23/16 1700 100 %     Weight --      Height --      Head Circumference --      Peak Flow --      Pain Score 04/23/16 1734 3     Pain Loc --      Pain Edu? --      Excl. in [place]? --  No data found.   Updated Vital Signs BP 109/66 (BP Location: Left Arm)   Pulse 73   Temp 98.2 F (36.8 C) (Oral)   Resp 16   SpO2 100%   Visual Acuity Right Eye Distance:   Left Eye Distance:   Bilateral Distance:    Right Eye Near:   Left Eye Near:    Bilateral Near:     Physical Exam  Constitutional: She is oriented to person, place, and time. She appears well-developed and well-nourished. No distress.  Neck: Normal range of motion. Neck supple.  Cardiovascular: Normal rate.   Pulmonary/Chest: Effort normal.  Abdominal: Soft. She exhibits no mass. Bowel sounds are decreased. There is no tenderness. There is no rebound and no guarding. No hernia.  Lymphadenopathy:    She has no cervical adenopathy.  Neurological: She is alert and oriented to person, place, and time.  Skin: Skin is warm and dry.  Nursing note and vitals reviewed.    UC Treatments / Results  Labs (all labs ordered are listed, but only abnormal results are  displayed) Labs Reviewed - No data to display  EKG  EKG Interpretation None       Radiology No results found.  Procedures Procedures (including critical care time)  Medications Ordered in UC Medications - No data to display   Initial Impression / Assessment and Plan / UC Course  I have reviewed the triage vital signs and the nursing notes.  Pertinent labs & imaging results that were available during my care of the patient were reviewed by me and considered in my medical decision making (see chart for details).  Clinical Course      Final Clinical Impressions(s) / UC Diagnoses   Final diagnoses:  Constipation, unspecified constipation type    New Prescriptions Discharge Medication List as of 04/23/2016  6:13 PM    START taking these medications   Details  polyethylene glycol-electrolytes (NULYTELY/GOLYTELY) 420 g solution Take 4,000 mLs by mouth once., Starting Tue 04/23/2016, Print         Billy Fischer, MD 04/23/16 1843

## 2016-05-01 MED FILL — ATOMOXETINE HCL 80 MG CAP: 80 | 30 days supply | Qty: 30 | Fill #6

## 2016-05-09 DIAGNOSIS — R140 Abdominal distension (gaseous): Secondary | ICD-10-CM | POA: Diagnosis not present

## 2016-05-09 DIAGNOSIS — R142 Eructation: Secondary | ICD-10-CM | POA: Diagnosis not present

## 2016-05-09 DIAGNOSIS — K219 Gastro-esophageal reflux disease without esophagitis: Secondary | ICD-10-CM | POA: Diagnosis not present

## 2016-05-09 DIAGNOSIS — K5904 Chronic idiopathic constipation: Secondary | ICD-10-CM | POA: Diagnosis not present

## 2016-05-10 MED FILL — OMEPRAZOLE DR 40 MG CAPSULE: 40 | 30 days supply | Qty: 30 | Fill #0

## 2016-05-10 MED FILL — LINZESS 290 MCG CAPSULE: 290 | 90 days supply | Qty: 90 | Fill #0

## 2016-05-23 MED FILL — METOPROLOL SUCC ER 50 MG TA: 50 | 90 days supply | Qty: 90 | Fill #2

## 2016-05-28 DIAGNOSIS — Z483 Aftercare following surgery for neoplasm: Secondary | ICD-10-CM | POA: Diagnosis not present

## 2016-05-28 DIAGNOSIS — Z885 Allergy status to narcotic agent status: Secondary | ICD-10-CM | POA: Diagnosis not present

## 2016-05-28 DIAGNOSIS — I10 Essential (primary) hypertension: Secondary | ICD-10-CM | POA: Diagnosis not present

## 2016-05-28 DIAGNOSIS — C419 Malignant neoplasm of bone and articular cartilage, unspecified: Secondary | ICD-10-CM | POA: Diagnosis not present

## 2016-05-28 DIAGNOSIS — Z79899 Other long term (current) drug therapy: Secondary | ICD-10-CM | POA: Diagnosis not present

## 2016-05-28 DIAGNOSIS — Z96642 Presence of left artificial hip joint: Secondary | ICD-10-CM | POA: Diagnosis not present

## 2016-05-28 DIAGNOSIS — Z9889 Other specified postprocedural states: Secondary | ICD-10-CM | POA: Diagnosis not present

## 2016-06-06 MED FILL — BUPROPION HCL XL 150 MG TAB: 150 | 90 days supply | Qty: 90 | Fill #1

## 2016-06-06 MED FILL — ATOMOXETINE HCL 80 MG CAP: 80 | 30 days supply | Qty: 30 | Fill #7

## 2016-06-27 MED FILL — AMOXICILLIN 500 MG CAPSULE: 500 | 2 days supply | Qty: 8 | Fill #0

## 2016-07-04 DIAGNOSIS — Z01419 Encounter for gynecological examination (general) (routine) without abnormal findings: Secondary | ICD-10-CM | POA: Diagnosis not present

## 2016-07-04 DIAGNOSIS — Z6827 Body mass index (BMI) 27.0-27.9, adult: Secondary | ICD-10-CM | POA: Diagnosis not present

## 2016-07-10 DIAGNOSIS — R102 Pelvic and perineal pain: Secondary | ICD-10-CM | POA: Diagnosis not present

## 2016-07-10 DIAGNOSIS — N941 Unspecified dyspareunia: Secondary | ICD-10-CM | POA: Diagnosis not present

## 2016-07-17 DIAGNOSIS — F900 Attention-deficit hyperactivity disorder, predominantly inattentive type: Secondary | ICD-10-CM | POA: Diagnosis not present

## 2016-07-17 DIAGNOSIS — I10 Essential (primary) hypertension: Secondary | ICD-10-CM | POA: Diagnosis not present

## 2016-07-17 DIAGNOSIS — F331 Major depressive disorder, recurrent, moderate: Secondary | ICD-10-CM | POA: Diagnosis not present

## 2016-07-17 MED FILL — ATOMOXETINE HCL 80 MG CAP: 80 | 30 days supply | Qty: 30 | Fill #0

## 2016-08-14 MED FILL — BUPROPION HCL XL 300 MG TAB: 300 | 30 days supply | Qty: 30 | Fill #0

## 2016-08-14 MED FILL — ATOMOXETINE HCL 80 MG CAP: 80 | 30 days supply | Qty: 30 | Fill #0

## 2016-09-04 MED FILL — ATOMOXETINE HCL 80 MG CAP: 80 | 90 days supply | Qty: 90 | Fill #0

## 2016-09-04 MED FILL — BUPROPION HCL XL 300 MG TAB: 300 | 90 days supply | Qty: 90 | Fill #0

## 2016-09-04 MED FILL — METOPROLOL SUCC ER 50 MG TA: 50 | 90 days supply | Qty: 90 | Fill #0

## 2016-09-05 MED FILL — LINZESS 290 MCG CAPSULE: 290 | 90 days supply | Qty: 90 | Fill #1

## 2016-10-30 DIAGNOSIS — H3540 Unspecified peripheral retinal degeneration: Secondary | ICD-10-CM | POA: Diagnosis not present

## 2016-10-30 DIAGNOSIS — H04123 Dry eye syndrome of bilateral lacrimal glands: Secondary | ICD-10-CM | POA: Diagnosis not present

## 2016-10-30 DIAGNOSIS — H5213 Myopia, bilateral: Secondary | ICD-10-CM | POA: Diagnosis not present

## 2016-11-13 DIAGNOSIS — R590 Localized enlarged lymph nodes: Secondary | ICD-10-CM | POA: Diagnosis not present

## 2016-11-14 ENCOUNTER — Other Ambulatory Visit: Payer: Self-pay | Admitting: Family Medicine

## 2016-11-14 DIAGNOSIS — R590 Localized enlarged lymph nodes: Secondary | ICD-10-CM

## 2016-11-21 ENCOUNTER — Other Ambulatory Visit: Payer: 59

## 2016-11-22 MED FILL — valACYclovir HCL 1 GM TABS: 1 | 30 days supply | Qty: 40 | Fill #0

## 2016-12-02 MED FILL — METOPROLOL SUCC ER 50 MG TA: 50 | 90 days supply | Qty: 90 | Fill #1

## 2016-12-13 MED FILL — ATOMOXETINE HCL 80 MG CAP: 80 | 90 days supply | Qty: 90 | Fill #1

## 2016-12-13 MED FILL — BUPROPION HCL XL 300 MG TAB: 300 | 90 days supply | Qty: 90 | Fill #1

## 2016-12-26 MED FILL — AMOXICILLIN 500 MG CAPSULE: 500 | 2 days supply | Qty: 8 | Fill #1

## 2017-01-31 MED FILL — CEPHALEXIN 500 MG CAPSULE: 500 | 5 days supply | Qty: 10 | Fill #0

## 2017-01-31 MED FILL — HYDROCODON-APAP 10-325: 10-325 | 4 days supply | Qty: 30 | Fill #0

## 2017-02-14 MED FILL — CIPROFLOXACIN HCL 500 MG TA: 500 | 7 days supply | Qty: 14 | Fill #0

## 2017-03-13 MED FILL — CIPROFLOXACIN HCL 500 MG TA: 500 | 7 days supply | Qty: 14 | Fill #1

## 2017-03-13 MED FILL — BUPROPION HCL XL 300 MG TAB: 300 | 90 days supply | Qty: 90 | Fill #2

## 2017-03-13 MED FILL — METOPROLOL SUCC ER 50 MG TA: 50 | 90 days supply | Qty: 90 | Fill #0

## 2017-03-24 MED FILL — ATOMOXETINE HCL 80 MG CAP: 80 | 90 days supply | Qty: 90 | Fill #2

## 2017-03-31 DIAGNOSIS — D649 Anemia, unspecified: Secondary | ICD-10-CM | POA: Diagnosis not present

## 2017-03-31 DIAGNOSIS — Z0189 Encounter for other specified special examinations: Secondary | ICD-10-CM | POA: Diagnosis not present

## 2017-06-03 DIAGNOSIS — M247 Protrusio acetabuli: Secondary | ICD-10-CM | POA: Diagnosis not present

## 2017-06-03 DIAGNOSIS — Z483 Aftercare following surgery for neoplasm: Secondary | ICD-10-CM | POA: Diagnosis not present

## 2017-06-03 DIAGNOSIS — C419 Malignant neoplasm of bone and articular cartilage, unspecified: Secondary | ICD-10-CM | POA: Diagnosis not present

## 2017-06-03 DIAGNOSIS — I10 Essential (primary) hypertension: Secondary | ICD-10-CM | POA: Diagnosis not present

## 2017-06-03 DIAGNOSIS — C4022 Malignant neoplasm of long bones of left lower limb: Secondary | ICD-10-CM | POA: Diagnosis not present

## 2017-06-03 DIAGNOSIS — Z96642 Presence of left artificial hip joint: Secondary | ICD-10-CM | POA: Diagnosis not present

## 2017-06-03 DIAGNOSIS — M438X7 Other specified deforming dorsopathies, lumbosacral region: Secondary | ICD-10-CM | POA: Diagnosis not present

## 2017-06-03 DIAGNOSIS — Z79899 Other long term (current) drug therapy: Secondary | ICD-10-CM | POA: Diagnosis not present

## 2017-06-03 DIAGNOSIS — Z888 Allergy status to other drugs, medicaments and biological substances status: Secondary | ICD-10-CM | POA: Diagnosis not present

## 2017-06-16 DIAGNOSIS — I10 Essential (primary) hypertension: Secondary | ICD-10-CM | POA: Diagnosis not present

## 2017-06-16 DIAGNOSIS — F419 Anxiety disorder, unspecified: Secondary | ICD-10-CM | POA: Diagnosis not present

## 2017-06-16 DIAGNOSIS — F900 Attention-deficit hyperactivity disorder, predominantly inattentive type: Secondary | ICD-10-CM | POA: Diagnosis not present

## 2017-06-16 DIAGNOSIS — B001 Herpesviral vesicular dermatitis: Secondary | ICD-10-CM | POA: Diagnosis not present

## 2017-06-16 MED FILL — METOPROLOL SUCC ER 50 MG TA: 50 | 90 days supply | Qty: 90 | Fill #0

## 2017-06-16 MED FILL — BUPROPION HCL XL 300 MG TAB: 300 | 90 days supply | Qty: 90 | Fill #0

## 2017-06-27 MED FILL — DEXTROAMP-AMPHETAMINE 5 MG: 5 | 30 days supply | Qty: 30 | Fill #0

## 2017-06-28 ENCOUNTER — Emergency Department (INDEPENDENT_AMBULATORY_CARE_PROVIDER_SITE_OTHER): Payer: 59

## 2017-06-28 ENCOUNTER — Emergency Department (INDEPENDENT_AMBULATORY_CARE_PROVIDER_SITE_OTHER)
Admission: EM | Admit: 2017-06-28 | Discharge: 2017-06-28 | Disposition: A | Discharge status: Home / Self Care | Payer: 59 | Source: Home / Self Care | Attending: Family Medicine | Admitting: Family Medicine

## 2017-06-28 ENCOUNTER — Encounter: Payer: Self-pay | Admitting: Emergency Medicine

## 2017-06-28 DIAGNOSIS — R059 Cough, unspecified: Secondary | ICD-10-CM

## 2017-06-28 DIAGNOSIS — R05 Cough: Secondary | ICD-10-CM

## 2017-06-28 DIAGNOSIS — R0981 Nasal congestion: Secondary | ICD-10-CM

## 2017-06-28 NOTE — Discharge Instructions (Signed)
°  You may try an over the counter nasal spray such as Flonase or Nasonex daily to help with nasal congestion and post-nasal drip, which can cause cough.  You may also take over the counter Claritin, Zyrtec or Allegra if you feel your cough is related to seasonal/environmental allergies.    Please follow up with your primary care provider if symptoms not improving in 1-2 weeks or if new symptoms develop- fever, chest tightness, vomiting.

## 2017-06-28 NOTE — ED Triage Notes (Signed)
Patient presents to [facility] with C/O productive cough over a month , denies fever, nasal congestion. Pt. Is a respiratory therapist.

## 2017-06-28 NOTE — ED Provider Notes (Signed)
[facility]    CSN: [id number] Arrival date & time: 06/28/17  1107     History   Chief Complaint Chief Complaint  Patient presents with  . Cough  . Nasal Congestion    HPI Alexandra Rowe is a 31 y.o. female.   HPI Alexandra Rowe is a 31 y.o. female presenting to UC with c/o intermittent productive cough for about 1 month, associated nasal congestion and Right ear fullness.  Denies fever, chills, n/v/d. 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 Denies recent travel. No hx of asthma. She has tried Mucinex with mild relief. Pt's 98yo son also in UC to be seen for a mild cough he has had for about 2 weeks.    Past Medical History:  Diagnosis Date  . Cancer ([place])   . Clear cell chondrosarcoma (HCC)   . Hx of echocardiogram    Echo (8/15):  EF 55-60%, no effusion  . Hypertension   . Obesity     Patient Active Problem List   Diagnosis Date Noted  . HTN (hypertension) 11/20/2012  . Palpitations 08/21/2012    Past Surgical History:  Procedure Laterality Date  . ABDOMINAL SURGERY    . none'    . TOTAL HIP ARTHROPLASTY      OB History    No data available       Home Medications    Prior to Admission medications   Medication Sig Start Date End Date Taking? Authorizing Provider  BuPROPion HCl (WELLBUTRIN PO) Take by mouth.    [provider]  Linaclotide (LINZESS PO) Take by mouth.    [provider]  metoprolol succinate (TOPROL-XL) 50 MG 24 hr tablet TAKE 1 TABLET BY MOUTH ONCE DAILY WITH OR IMMEDIATELY FOLLOWING A MEAL 07/14/15   Nahser, Wonda Cheng, MD  METOPROLOL TARTRATE PO Take by mouth.    [provider]    Family History Family History  Problem Relation Age of Onset  . Heart attack Maternal Grandfather   . Hypertension Unknown   . Cancer Unknown   . Hypertension Father     Social History Social History  Substance Use Topics    . Smoking status: Never Smoker  . Smokeless tobacco: Never Used  . Alcohol use No     Comment: socially     Allergies   Oxycodone and Codeine   Review of Systems Review of Systems  Constitutional: Negative for chills and fever.  HENT: Positive for congestion and ear pain (Right fullness). Negative for sore throat, trouble swallowing and voice change.   Respiratory: Positive for cough. Negative for shortness of breath.   Cardiovascular: Negative for chest pain and palpitations.  Gastrointestinal: Negative for abdominal pain, diarrhea, nausea and vomiting.  Musculoskeletal: Negative for arthralgias, back pain and myalgias.  Skin: Negative for rash.     Physical Exam Triage Vital Signs ED Triage Vitals  Enc Vitals Group     BP 06/28/17 1135 (!) 136/91     Pulse Rate 06/28/17 1135 64     Resp 06/28/17 1135 16     Temp 06/28/17 1135 98.5 F (36.9 C)     Temp Source 06/28/17 1135 Oral     SpO2 06/28/17 1135 96 %     Weight 06/28/17 1135 152 lb (68.9 kg)     Height 06/28/17 1135 5\' 3"  (1.6 m)     Head Circumference --  Peak Flow --      Pain Score 06/28/17 1136 2     Pain Loc --      Pain Edu? --      Excl. in [place]? --    No data found.   Updated Vital Signs BP (!) 136/91 (BP Location: Left Arm)   Pulse 64   Temp 98.5 F (36.9 C) (Oral)   Resp 16   Ht 5\' 3"  (1.6 m)   Wt 152 lb (68.9 kg)   SpO2 96%   BMI 26.93 kg/m   Visual Acuity Right Eye Distance:   Left Eye Distance:   Bilateral Distance:    Right Eye Near:   Left Eye Near:    Bilateral Near:     Physical Exam  Constitutional: She is oriented to person, place, and time. She appears well-developed and well-nourished. No distress.  HENT:  Head: Normocephalic and atraumatic.  Right Ear: Tympanic membrane normal. Tympanic membrane is not erythematous and not bulging.  Left Ear: Tympanic membrane normal. Tympanic membrane is not erythematous and not bulging.  Nose: Nose normal. Right sinus exhibits  no maxillary sinus tenderness and no frontal sinus tenderness. Left sinus exhibits no maxillary sinus tenderness and no frontal sinus tenderness.  Mouth/Throat: Uvula is midline, oropharynx is clear and moist and mucous membranes are normal.  Eyes: EOM are normal.  Neck: Normal range of motion. Neck supple.  Cardiovascular: Normal rate and regular rhythm.   Pulmonary/Chest: Effort normal and breath sounds normal. No stridor. No respiratory distress. She has no wheezes. She has no rales.  No cough during exam.  Musculoskeletal: Normal range of motion.  Lymphadenopathy:    She has no cervical adenopathy.  Neurological: She is alert and oriented to person, place, and time.  Skin: Skin is warm and dry. She is not diaphoretic.  Psychiatric: She has a normal mood and affect. Her behavior is normal.  Nursing note and vitals reviewed.    UC Treatments / Results  Labs (all labs ordered are listed, but only abnormal results are displayed) Labs Reviewed - No data to display  EKG  EKG Interpretation None       Radiology Dg Chest 2 View  Result Date: 06/28/2017 CLINICAL DATA:  Productive cough for 1 month. EXAM: CHEST  2 VIEW COMPARISON:  April 05, 2013 FINDINGS: The heart size and mediastinal contours are within normal limits. Both lungs are clear. The visualized skeletal structures are unremarkable. IMPRESSION: No active cardiopulmonary disease. Electronically Signed   By: Dorise Bullion III M.D   On: 06/28/2017 11:58    Procedures Procedures (including critical care time)  Medications Ordered in UC Medications - No data to display   Initial Impression / Assessment and Plan / UC Course  I have reviewed the triage vital signs and the nursing notes.  Pertinent labs & imaging results that were available during my care of the patient were reviewed by me and considered in my medical decision making (see chart for details).     Offered empiric treatment with Azithromycin given  duration of cough vs CXR.  Pt requested CXR CXR: Normal  Reassured pt of normal CXR Offered prescription cough medication and nasal spray, pt declined.   May continue use of OTC Mucinex, however, encouraged use of Flonase or Nasonex to help with nasal congestion and post-nasal drip.  Encouraged to take OTC Claritin, Zyrtec or Allegra if pt feels cough is triggered by seasonal/environmental allergies F/u with PCP in 1-2 weeks if not improving,  sooner if new symptoms develop.   Final Clinical Impressions(s) / UC Diagnoses   Final diagnoses:  Cough  Nasal congestion    New Prescriptions Discharge Medication List as of 06/28/2017 12:08 PM       Controlled Substance Prescriptions [place] Controlled Substance Registry consulted? Not Applicable   Tyrell Antonio 06/28/17 1218

## 2017-07-22 MED FILL — ATOMOXETINE HCL 100 MG CAP: 100 | 30 days supply | Qty: 30 | Fill #0

## 2017-07-23 DIAGNOSIS — Z6828 Body mass index (BMI) 28.0-28.9, adult: Secondary | ICD-10-CM | POA: Diagnosis not present

## 2017-07-23 DIAGNOSIS — Z01419 Encounter for gynecological examination (general) (routine) without abnormal findings: Secondary | ICD-10-CM | POA: Diagnosis not present

## 2017-08-14 MED FILL — AMOXICILLIN 500 MG CAPSULE: 500 | 2 days supply | Qty: 8 | Fill #0

## 2017-08-21 MED FILL — ATOMOXETINE HCL 100 MG CAP: 100 | 30 days supply | Qty: 30 | Fill #1

## 2017-08-24 ENCOUNTER — Encounter (HOSPITAL_COMMUNITY): Payer: Self-pay | Admitting: *Deleted

## 2017-08-24 ENCOUNTER — Ambulatory Visit (HOSPITAL_COMMUNITY)
Admission: EM | Admit: 2017-08-24 | Discharge: 2017-08-24 | Disposition: A | Discharge status: Home / Self Care | Payer: 59 | Attending: Internal Medicine | Admitting: Internal Medicine

## 2017-08-24 ENCOUNTER — Ambulatory Visit (INDEPENDENT_AMBULATORY_CARE_PROVIDER_SITE_OTHER): Payer: 59

## 2017-08-24 ENCOUNTER — Other Ambulatory Visit: Payer: Self-pay

## 2017-08-24 DIAGNOSIS — R52 Pain, unspecified: Secondary | ICD-10-CM

## 2017-08-24 DIAGNOSIS — M25552 Pain in left hip: Secondary | ICD-10-CM | POA: Diagnosis not present

## 2017-08-24 DIAGNOSIS — M25562 Pain in left knee: Secondary | ICD-10-CM

## 2017-08-24 LAB — POCT PREGNANCY, URINE: Preg Test, Ur: NEGATIVE

## 2017-08-24 NOTE — ED Provider Notes (Signed)
[place]    CSN: [id number] Arrival date & time: 08/24/17  1912     History   Chief Complaint Chief Complaint  Patient presents with  . Hip Pain  . Knee Pain    HPI Alexandra Rowe is a 31 y.o. female.   [name] presents with complaints of left knee and left hip pain which started over the past month but worsened today. 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 She has been following with orthopedics since, last seen in 05/2017. No injury to hip or knee. At rest does have distinct separate knee pain. The pain does seem to radiate from hip down to calf. Tingling to lower leg is present. Without calf pain, redness, tenderness. ROM to left hip at baseline per patient. Full knee ROM present. Pain is worse with walking but still present and constant at rest. Currently rates it 4/10, states it is dull. Took 800mg  ibuprofen today which did not help. She states she has not been to the gym in the past 2 weeks. No other known exacerbating factors.    ROS per HPI.       Past Medical History:  Diagnosis Date  . Cancer ([place])   . Clear cell chondrosarcoma (HCC)   . Hx of echocardiogram    Echo (8/15):  EF 55-60%, no effusion  . Hypertension   . Obesity     Patient Active Problem List   Diagnosis Date Noted  . HTN (hypertension) 11/20/2012  . Palpitations 08/21/2012    Past Surgical History:  Procedure Laterality Date  . ABDOMINAL SURGERY    . TOTAL HIP ARTHROPLASTY      OB History    No data available       Home Medications    Prior to Admission medications   Medication Sig Start Date End Date Taking? Authorizing Provider  Atomoxetine HCl (STRATTERA PO) Take by mouth.   Yes [provider]  BuPROPion HCl (WELLBUTRIN PO) Take by mouth.   Yes [provider]  Linaclotide (LINZESS PO) Take by mouth.   Yes [provider]  metoprolol succinate (TOPROL-XL) 50 MG  24 hr tablet TAKE 1 TABLET BY MOUTH ONCE DAILY WITH OR IMMEDIATELY FOLLOWING A MEAL 07/14/15  Yes Nahser, Wonda Cheng, MD    Family History Family History  Problem Relation Age of Onset  . Heart attack Maternal Grandfather   . Hypertension Unknown   . Cancer Unknown   . Hypertension Father     Social History Social History   Tobacco Use  . Smoking status: Never Smoker  . Smokeless tobacco: Never Used  Substance Use Topics  . Alcohol use: No  . Drug use: No     Allergies   Oxycodone and Codeine   Review of Systems Review of Systems   Physical Exam Triage Vital Signs ED Triage Vitals  Enc Vitals Group     BP 08/24/17 1937 129/87     Pulse Rate 08/24/17 1937 77     Resp 08/24/17 1937 16     Temp 08/24/17 1937 98.3 F (36.8 C)     Temp Source 08/24/17 1937 Oral     SpO2 08/24/17 1937 100 %     Weight --      Height --      Head Circumference --      Peak Flow --      Pain Score 08/24/17 1938 5  Pain Loc --      Pain Edu? --      Excl. in [place]? --    No data found.  Updated Vital Signs BP 129/87   Pulse 77   Temp 98.3 F (36.8 C) (Oral)   Resp 16   SpO2 100%   Visual Acuity Right Eye Distance:   Left Eye Distance:   Bilateral Distance:    Right Eye Near:   Left Eye Near:    Bilateral Near:     Physical Exam  Constitutional: She is oriented to person, place, and time. She appears well-developed and well-nourished. No distress.  Cardiovascular: Normal rate, regular rhythm and normal heart sounds.  Pulmonary/Chest: Effort normal and breath sounds normal.  Musculoskeletal:       Left hip: She exhibits normal strength, no tenderness, no bony tenderness, no swelling and no crepitus.       Left knee: She exhibits normal range of motion, no swelling, no effusion, no erythema, no LCL laxity, no bony tenderness, normal meniscus and no MCL laxity. No tenderness found.  Hip flexion at 90 degrees, patient states this is baseline; unable to reproduce pain with  palpation, no pain with straight leg raise; knee with complete flexion and extension; gross sensation intact; without redness, swelling or warmth; without tenderness to knee, without laxity; negative drawer test; without pain with palpation; indicates discomfort is to the medial tibia  Neurological: She is alert and oriented to person, place, and time.  Skin: Skin is warm and dry.     UC Treatments / Results  Labs (all labs ordered are listed, but only abnormal results are displayed) Labs Reviewed  POCT PREGNANCY, URINE    EKG  EKG Interpretation None       Radiology Dg Knee Complete 4 Views Left  Result Date: 08/24/2017 CLINICAL DATA:  Pain EXAM: LEFT KNEE - COMPLETE 4+ VIEW COMPARISON:  None. FINDINGS: No evidence of fracture, dislocation, or joint effusion. No evidence of arthropathy or other focal bone abnormality. Soft tissues are unremarkable. IMPRESSION: Negative. Electronically Signed   By: Rolm Baptise M.D.   On: 08/24/2017 20:16   Dg Hip Unilat With Pelvis 2-3 Views Left  Result Date: 08/24/2017 CLINICAL DATA:  Pain EXAM: DG HIP (WITH OR WITHOUT PELVIS) 2-3V LEFT COMPARISON:  None. FINDINGS: Changes of left hip replacement. No hardware bony complicating feature. No acute bony abnormality. Specifically, no fracture, subluxation, or dislocation. Soft tissues are intact. IUD noted in the pelvis. IMPRESSION: Prior left hip replacement. No acute bony abnormality or hardware complicating feature. Electronically Signed   By: Rolm Baptise M.D.   On: 08/24/2017 20:17    Procedures Procedures (including critical care time)  Medications Ordered in UC Medications - No data to display   Initial Impression / Assessment and Plan / UC Course  I have reviewed the triage vital signs and the nursing notes.  Pertinent labs & imaging results that were available during my care of the patient were reviewed by me and considered in my medical decision making (see chart for details).      X-rays without acute findings at this time. RICE therapy, NSAIDS. Follow up with orthopedics for further evaluation and treatment. Patient verbalized understanding and agreeable to plan.  Ambulatory out of clinic without difficulty.    Final Clinical Impressions(s) / UC Diagnoses   Final diagnoses:  Pain  Left hip pain  Left knee pain, unspecified chronicity    ED Discharge Orders    None  Controlled Substance Prescriptions [place] Controlled Substance Registry consulted? Not Applicable   Zigmund Gottron, NP 08/24/17 2026

## 2017-08-24 NOTE — Discharge Instructions (Signed)
Your x-rays are negative for acute findings tonight. I would recommend continuing with NSAIDS, take with food. I would follow up with your orthopedist for further evaluation as further imaging or treatments for pain may be appropriate.

## 2017-08-24 NOTE — ED Triage Notes (Signed)
Reports hx of clear cell chondrosarcoma in left hip 5 yrs ago.  Had most recent oncology f/u in 05/2017.  Started over past month with left hip pain and some left knee pain also without injury.  Left hip pain has been significant throughout today.

## 2017-08-27 DIAGNOSIS — Z3202 Encounter for pregnancy test, result negative: Secondary | ICD-10-CM | POA: Diagnosis not present

## 2017-08-27 DIAGNOSIS — Z30433 Encounter for removal and reinsertion of intrauterine contraceptive device: Secondary | ICD-10-CM | POA: Diagnosis not present

## 2017-09-16 MED FILL — ATOMOXETINE HCL 100 MG CAP: 100 | 30 days supply | Qty: 30 | Fill #0

## 2017-09-16 MED FILL — BUPROPION HCL XL 300 MG TAB: 300 | 90 days supply | Qty: 90 | Fill #0

## 2017-09-16 MED FILL — METOPROLOL SUCC ER 50 MG TA: 50 | 90 days supply | Qty: 90 | Fill #0

## 2017-10-07 MED FILL — ADDERALL XR 20 MG CAP SA: 20 | 30 days supply | Qty: 30 | Fill #0

## 2017-11-27 MED FILL — ATOMOXETINE HCL 80 MG CAPS: 80 | 30 days supply | Qty: 30 | Fill #0

## 2017-12-09 MED FILL — FLUCONAZOLE 150 MG TABLET: 150 | 1 days supply | Qty: 1 | Fill #0

## 2017-12-09 MED FILL — CEPHALEXIN 500 MG CAPSULE: 500 | 5 days supply | Qty: 10 | Fill #0

## 2017-12-09 MED FILL — HYDROCODON-APAP 10-325: 10-325 | 4 days supply | Qty: 30 | Fill #0

## 2017-12-22 MED FILL — BUPROPION HCL XL 300 MG TAB: 300 | 90 days supply | Qty: 90 | Fill #0

## 2017-12-22 MED FILL — METOPROLOL SUCCINATE ER 50: 50 | 90 days supply | Qty: 90 | Fill #0

## 2017-12-30 MED FILL — STRATTERA 80 MG CAPSULE: 80 | 30 days supply | Qty: 30 | Fill #0

## 2018-01-06 MED FILL — CYCLOBENZAPRINE 10 MG TAB: 10 | 10 days supply | Qty: 30 | Fill #0

## 2018-01-06 MED FILL — MELOXICAM 7.5 MG TABLET: 7.5 | 30 days supply | Qty: 30 | Fill #0

## 2018-02-03 MED FILL — STRATTERA 80 MG CAPSULE: 80 | 30 days supply | Qty: 30 | Fill #1

## 2018-03-31 MED FILL — BUPROPION HCL XL 300 MG TAB: 300 | 30 days supply | Qty: 30 | Fill #0

## 2018-03-31 MED FILL — METOPROLOL SUCCINATE ER 50: 50 | 90 days supply | Qty: 90 | Fill #0

## 2018-05-05 MED FILL — VYVANSE 30 MG CAPSULE: 30 | 30 days supply | Qty: 30 | Fill #0

## 2018-05-14 MED FILL — BuPROPion HCL ER (XL) 300 M: 300 | 30 days supply | Qty: 30 | Fill #1

## 2018-06-08 MED FILL — BuPROPion HCL ER (XL) 300 M: 300 | 30 days supply | Qty: 30 | Fill #2

## 2018-06-09 MED FILL — valACYclovir HCL 1 GM TABS: 1 | 10 days supply | Qty: 40 | Fill #0

## 2018-06-09 MED FILL — VYVANSE 50 MG CAPSULE: 50 | 30 days supply | Qty: 30 | Fill #0

## 2018-06-09 MED FILL — ALPRAZolam 0.25 MG TABS: 0.25 | 15 days supply | Qty: 30 | Fill #0

## 2018-07-14 MED FILL — BuPROPion HCL ER (XL) 300 M: 300 | 30 days supply | Qty: 30 | Fill #3

## 2018-07-14 MED FILL — METOPROLOL SUCCINATE ER 50: 50 | 90 days supply | Qty: 90 | Fill #0

## 2018-07-15 MED FILL — VYVANSE 50 MG CAPSULE: 50 | 30 days supply | Qty: 30 | Fill #0

## 2018-08-12 MED FILL — buPROPion HCL ER (XL) 300 M: 300 | 30 days supply | Qty: 30 | Fill #0

## 2018-08-12 MED FILL — SERTRALINE HCL 25 MG TABLET: 25 | 30 days supply | Qty: 30 | Fill #0

## 2018-08-24 MED FILL — VYVANSE 50 MG CAPSULE: 50 | 30 days supply | Qty: 30 | Fill #0

## 2018-09-24 MED FILL — ESCITALOPRAM 10 MG TABLET: 10 | 30 days supply | Qty: 30 | Fill #0

## 2018-09-24 MED FILL — buPROPion HCL ER (XL) 300 M: 300 | 30 days supply | Qty: 30 | Fill #1

## 2018-10-19 MED FILL — ADDERALL XR 30 MG CAP SA: 30 | 30 days supply | Qty: 30 | Fill #0

## 2018-10-29 MED FILL — buPROPion HCL ER (XL) 300 M: 300 | 30 days supply | Qty: 30 | Fill #2 | Status: TO

## 2018-10-29 MED FILL — ESCITALOPRAM 10 MG TABLET: 10 | 30 days supply | Qty: 30 | Fill #1 | Status: TO

## 2018-10-29 MED FILL — METOPROLOL SUCCINATE ER 50: 50 | 90 days supply | Qty: 90 | Fill #0

## 2018-12-02 MED FILL — ESCITALOPRAM 10 MG TABLET: 10 | 30 days supply | Qty: 30 | Fill #0

## 2018-12-03 MED FILL — ADDERALL XR 30 MG CAP SA: 30 | 30 days supply | Qty: 30 | Fill #0

## 2018-12-23 MED FILL — buPROPion HCL ER (XL) 300 M: 300 | 30 days supply | Qty: 30 | Fill #0

## 2019-01-18 MED FILL — ESCITALOPRAM 10 MG TABLET: 10 | 30 days supply | Qty: 30 | Fill #1

## 2019-01-18 MED FILL — buPROPion HCL ER (XL) 300 M: 300 | 30 days supply | Qty: 30 | Fill #1

## 2019-01-19 MED FILL — ADDERALL XR 30 MG CAP SA: 30 | 30 days supply | Qty: 30 | Fill #0

## 2019-02-28 MED FILL — buPROPion HCL ER (XL) 300 M: 300 | 30 days supply | Qty: 30 | Fill #2

## 2019-03-01 MED FILL — ESCITALOPRAM 10 MG TABLET: 10 | 30 days supply | Qty: 30 | Fill #2

## 2019-03-01 MED FILL — ADDERALL XR 30 MG CAP SA: 30 | 30 days supply | Qty: 30 | Fill #0

## 2019-03-05 MED FILL — METOPROLOL SUCCINATE ER 50: 50 | 90 days supply | Qty: 90 | Fill #0

## 2019-03-25 IMAGING — DX DG CHEST 2V
2 series · 2 of 2 positions shown · non-contrast
Comparison: April 05, 2013

CLINICAL DATA: Productive cough for 1 month.

EXAM:
CHEST  2 VIEW

[chest pa]
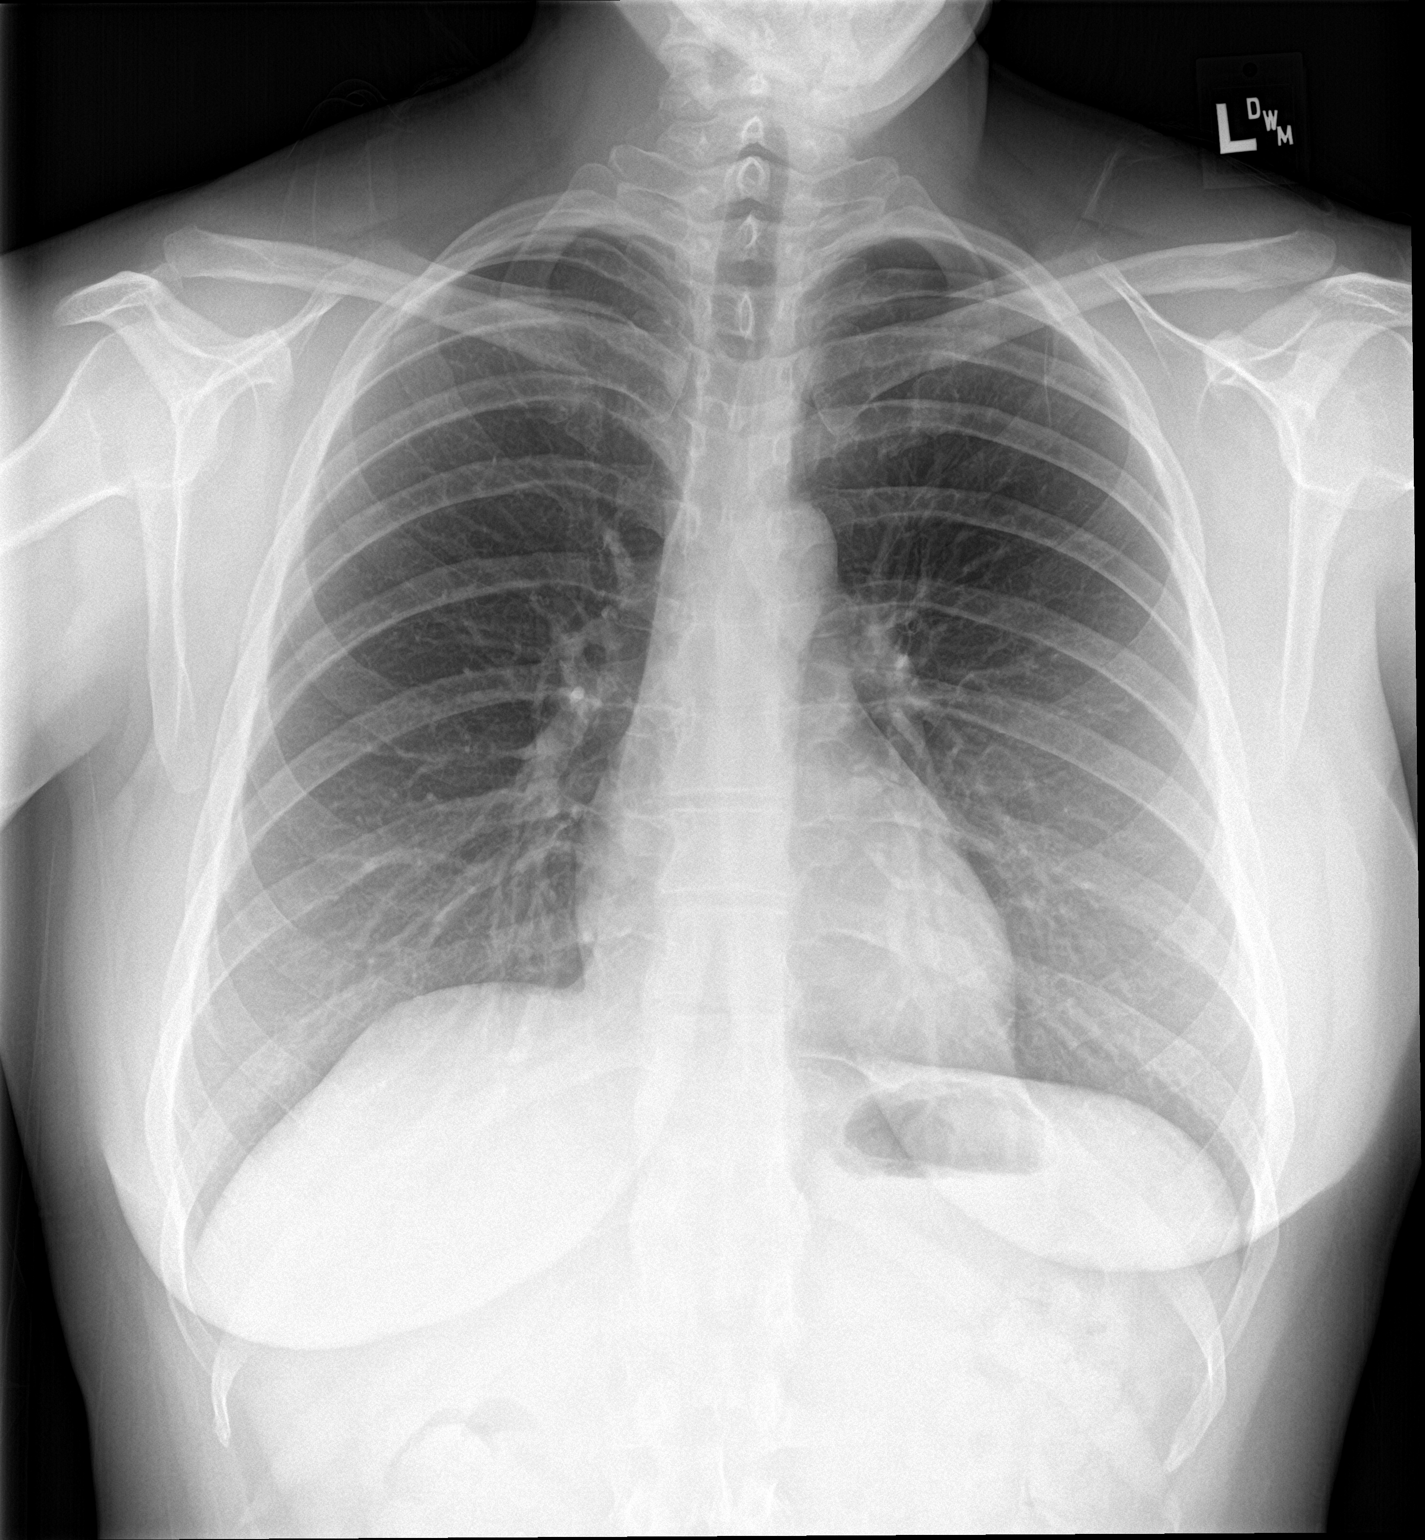

[chest lat]
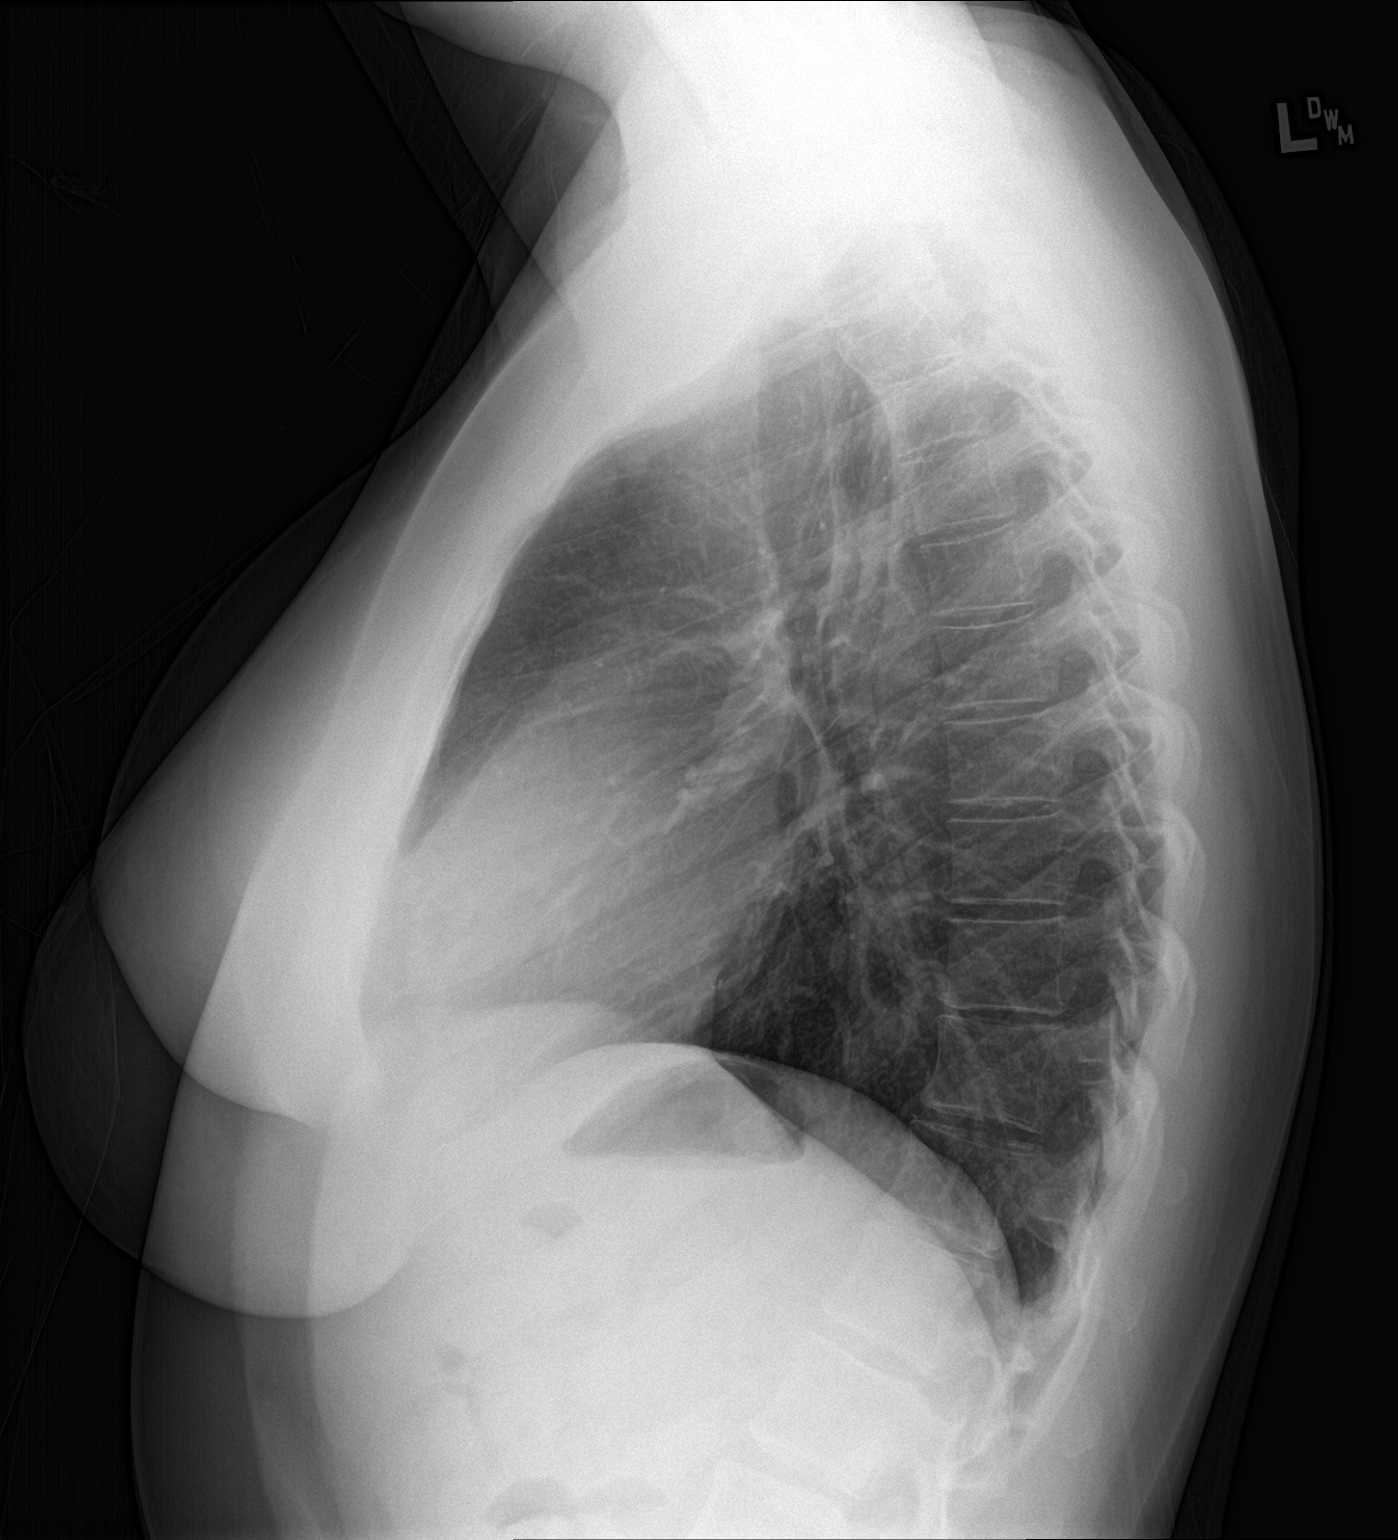

[2 of 2 positions shown; findings below may reference images not displayed]

FINDINGS: The heart size and mediastinal contours are within normal limits.
Both lungs are clear. The visualized skeletal structures are
unremarkable.
IMPRESSION: No active cardiopulmonary disease.

## 2019-04-19 MED FILL — ESCITALOPRAM 10 MG TABLET: 10 | 30 days supply | Qty: 30 | Fill #0

## 2019-04-27 MED FILL — ADDERALL XR 30 MG CAP SA: 30 | 30 days supply | Qty: 30 | Fill #0

## 2019-05-14 MED FILL — ADDERALL XR 30 MG CAP SA: 30 | 30 days supply | Qty: 30 | Fill #0

## 2019-05-21 MED FILL — ESCITALOPRAM 10 MG TABLET: 10 | 30 days supply | Qty: 30 | Fill #1

## 2019-05-21 MED FILL — buPROPion HCL ER (XL) 300 M: 300 | 30 days supply | Qty: 30 | Fill #0

## 2019-07-03 MED FILL — buPROPion HCL ER (XL) 300 M: 300 | 30 days supply | Qty: 30 | Fill #1

## 2019-07-03 MED FILL — METOPROLOL SUCCINATE ER 50: 50 | 90 days supply | Qty: 90 | Fill #0

## 2019-07-03 MED FILL — ESCITALOPRAM 10 MG TABLET: 10 | 30 days supply | Qty: 30 | Fill #2

## 2019-07-19 MED FILL — BUPROPION HCL ER (XL) 300 M: 300 | 30 days supply | Qty: 30 | Fill #1

## 2019-07-19 MED FILL — ESCITALOPRAM 10 MG TABLET: 10 | 30 days supply | Qty: 30 | Fill #2

## 2019-08-24 ENCOUNTER — Encounter (INDEPENDENT_AMBULATORY_CARE_PROVIDER_SITE_OTHER): Payer: No Typology Code available for payment source | Admitting: Neurology

## 2019-08-24 ENCOUNTER — Other Ambulatory Visit: Payer: Self-pay

## 2019-08-24 ENCOUNTER — Ambulatory Visit: Payer: No Typology Code available for payment source | Admitting: Neurology

## 2019-08-24 DIAGNOSIS — Z0289 Encounter for other administrative examinations: Secondary | ICD-10-CM

## 2019-08-24 DIAGNOSIS — R200 Anesthesia of skin: Secondary | ICD-10-CM | POA: Diagnosis not present

## 2019-08-24 DIAGNOSIS — R202 Paresthesia of skin: Secondary | ICD-10-CM | POA: Diagnosis not present

## 2019-08-24 DIAGNOSIS — G5603 Carpal tunnel syndrome, bilateral upper limbs: Secondary | ICD-10-CM

## 2019-08-24 MED FILL — LINZESS 290 MCG CAPSULE: 290 | 30 days supply | Qty: 30 | Fill #0

## 2019-08-24 NOTE — Progress Notes (Addendum)
Full Name: Alexandra Rowe Gender: Female MRN #: [id number] Date of Birth: 05-11-1986    Visit Date: 08/24/2019 07:46 Age: 33 Years Examining Physician: Arlice Colt, MD  Referring Physician: Mat Carne, PA-C Height: 5 feet 3 inch    History: Alexandra Rowe is a 33 year old woman with right foot and left wrist and hand pain.  Additionally, there is sometimes pain higher up in the right arm.  She will have pain that shoots into the left hand at the wrist at times with activity.  Nerve conduction studies:  The left median motor response was slowed across the wrist with a reduced amplitude.  The right median, left ulnar and right ulnar motor responses were normal.  Bilateral ulnar F wave latencies were normal.  Bilateral median sensory responses were slowed across the wrist, slightly worse on the right than the left.  Amplitudes were low normal.  Electromyography: Needle EMG of the right arm and both hands were performed.  The left abductor pollicis brevis muscle had some polyphasic motor units with a neuropathic recruitment.  Other muscles tested were normal motor unit morphology and recruitment.  There was no abnormal spontaneous activity.  Impression: This NCV/EMG study shows the following: 1.   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   Mild right median neuropathy across the wrist (carpal tunnel syndrome) 3.   No evidence of superimposed radiculopathies.   Alexandra Rowe A. Felecia Shelling, MD, PhD, FAAN Certified in Neurology, Clinical Neurophysiology, Sleep Medicine, Pain Medicine and Neuroimaging Director, [place] [facility] [address] [place], [place] [address] [phone]        [facility]    Nerve / Sites Muscle Latency Ref. Amplitude Ref. Rel Amp Segments Distance Velocity Ref. Area    ms ms mV mV %  cm m/s m/s mVms  L Median - APB     Wrist APB  5.5 ?4.4 3.6 ?4.0 100 Wrist - APB 7   8.5     Upper arm APB 8.8  3.9  106 Upper arm - Wrist 20 61 ?49 15.2  R Median - APB     Wrist APB 3.9 ?4.4 5.9 ?4.0 100 Wrist - APB 7   18.3     Upper arm APB 7.4  5.8  98.4 Upper arm - Wrist 20 57 ?49 20.4  L Ulnar - ADM     Wrist ADM 2.6 ?3.3 4.8 ?6.0 100 Wrist - ADM 7   12.2     B.Elbow ADM 5.1  6.7  139 B.Elbow - Wrist 18 71 ?49 20.4     A.Elbow ADM 6.6  8.4  125 A.Elbow - B.Elbow 10 68 ?49 26.4     Median Elbow ADM 12.4  0.7  7.79 A.Elbow - Wrist    0.9         Median Elbow - ADM      R Ulnar - ADM     Wrist ADM 2.5 ?3.3 10.7 ?6.0 100 Wrist - ADM 7   29.0     B.Elbow ADM 5.3  10.6  99 B.Elbow - Wrist 18 64 ?49 28.6     A.Elbow ADM 6.7  10.6  99.3 A.Elbow - B.Elbow 10 73 ?49 28.3         A.Elbow - Wrist                 SNC    Nerve /  Sites Rec. Site Peak Lat Ref.  Amp Ref. Segments Distance Peak Diff Ref.    ms ms V V  cm ms ms  L Median, Ulnar - Transcarpal comparison     Median Palm Wrist 2.7 ?2.2 43 ?35 Median Palm - Wrist 8       Ulnar Palm Wrist 1.6 ?2.2 23 ?12 Ulnar Palm - Wrist 8          Median Palm - Ulnar Palm  1.1 ?0.4  R Median, Ulnar - Transcarpal comparison     Median Palm Wrist 2.8 ?2.2 38 ?35 Median Palm - Wrist 8       Ulnar Palm Wrist 1.7 ?2.2 19 ?12 Ulnar Palm - Wrist 8          Median Palm - Ulnar Palm  1.1 ?0.4  L Median - Orthodromic (Dig II, Mid palm)     Dig II Wrist 3.7 ?3.4 10 ?10 Dig II - Wrist 13    R Median - Orthodromic (Dig II, Mid palm)     Dig II Wrist 3.9 ?3.4 9 ?10 Dig II - Wrist 13    L Ulnar - Orthodromic, (Dig V, Mid palm)     Dig V Wrist 2.3 ?3.1 10 ?5 Dig V - Wrist 11    R Ulnar - Orthodromic, (Dig V, Mid palm)     Dig V Wrist 2.4 ?3.1 7 ?5 Dig V - Wrist 71                   F  Wave    Nerve F Lat Ref.   ms ms  L Ulnar - ADM 25.8 ?32.0  R Ulnar - ADM 24.8 ?32.0         EMG Summary Table    Spontaneous MUAP Recruitment  Muscle IA Fib PSW Fasc Other Amp Dur. Poly Pattern  R. Deltoid  Normal None None None _______ Normal Normal Normal Normal  R. Triceps brachii Normal None None None _______ Normal Normal Normal Normal  R. Biceps brachii Normal None None None _______ Normal Normal Normal Normal  R. Extensor digitorum communis Normal None None None _______ Normal Normal Normal Normal  R. First dorsal interosseous Normal None None None _______ Normal Normal Normal Normal  R. Abductor pollicis brevis Normal None None None _______ Normal Normal Normal Normal  R. Rhomboid major Normal None None None _______ Normal Normal Normal Normal  L. First dorsal interosseous Normal None None None _______ Normal Normal Normal Normal  L. Abductor pollicis brevis Normal None None None _______ Normal Normal 1+ Reduced

## 2019-08-26 ENCOUNTER — Telehealth: Payer: Self-pay | Admitting: Neurology

## 2019-08-26 NOTE — Telephone Encounter (Signed)
Patient called to check on the status of her EMG/NCV results were sent to [facility] [place] [facility] [employer]. Please follow up.

## 2019-08-30 NOTE — Telephone Encounter (Signed)
Done faxed to [place] on [phone]

## 2019-09-02 MED FILL — ADDERALL XR 30 MG CAP SA: 30 | 30 days supply | Qty: 30 | Fill #0

## 2019-09-06 MED FILL — BUPROPION HCL ER (XL) 300 M: 300 | 30 days supply | Qty: 30 | Fill #2

## 2019-09-06 MED FILL — AMOXICILLIN 500 MG CAPSULE: 500 | 2 days supply | Qty: 8 | Fill #0

## 2019-09-06 MED FILL — ESCITALOPRAM 10 MG TABLET: 10 | 30 days supply | Qty: 30 | Fill #0

## 2019-09-06 MED FILL — PHENTERMINE 37.5 MG TABLET: 37.5 | 30 days supply | Qty: 30 | Fill #1

## 2020-01-07 ENCOUNTER — Emergency Department (HOSPITAL_BASED_OUTPATIENT_CLINIC_OR_DEPARTMENT_OTHER): Payer: No Typology Code available for payment source

## 2020-01-07 ENCOUNTER — Other Ambulatory Visit: Payer: Self-pay

## 2020-01-07 ENCOUNTER — Encounter (HOSPITAL_BASED_OUTPATIENT_CLINIC_OR_DEPARTMENT_OTHER): Payer: Self-pay | Admitting: *Deleted

## 2020-01-07 ENCOUNTER — Emergency Department (HOSPITAL_BASED_OUTPATIENT_CLINIC_OR_DEPARTMENT_OTHER)
Admission: EM | Admit: 2020-01-07 | Discharge: 2020-01-07 | Disposition: A | Discharge status: Home / Self Care | Payer: No Typology Code available for payment source | Attending: Emergency Medicine | Admitting: Emergency Medicine

## 2020-01-07 DIAGNOSIS — M25561 Pain in right knee: Secondary | ICD-10-CM

## 2020-01-07 DIAGNOSIS — I10 Essential (primary) hypertension: Secondary | ICD-10-CM | POA: Diagnosis not present

## 2020-01-07 DIAGNOSIS — W010XXA Fall on same level from slipping, tripping and stumbling without subsequent striking against object, initial encounter: Secondary | ICD-10-CM | POA: Insufficient documentation

## 2020-01-07 DIAGNOSIS — M25461 Effusion, right knee: Secondary | ICD-10-CM | POA: Diagnosis not present

## 2020-01-07 DIAGNOSIS — Y999 Unspecified external cause status: Secondary | ICD-10-CM | POA: Diagnosis not present

## 2020-01-07 DIAGNOSIS — S8001XA Contusion of right knee, initial encounter: Secondary | ICD-10-CM

## 2020-01-07 DIAGNOSIS — Y9302 Activity, running: Secondary | ICD-10-CM | POA: Diagnosis not present

## 2020-01-07 DIAGNOSIS — Z79899 Other long term (current) drug therapy: Secondary | ICD-10-CM | POA: Insufficient documentation

## 2020-01-07 DIAGNOSIS — G8911 Acute pain due to trauma: Secondary | ICD-10-CM | POA: Diagnosis not present

## 2020-01-07 DIAGNOSIS — S8991XA Unspecified injury of right lower leg, initial encounter: Secondary | ICD-10-CM | POA: Diagnosis present

## 2020-01-07 DIAGNOSIS — Y9289 Other specified places as the place of occurrence of the external cause: Secondary | ICD-10-CM | POA: Diagnosis not present

## 2020-01-07 MED ORDER — KETOROLAC TROMETHAMINE 15 MG/ML IJ SOLN
15.0000 mg | Freq: Once | INTRAMUSCULAR | Status: AC
  Administered 2020-01-07: 23:00:00 15 mg via INTRAMUSCULAR
  Filled 2020-01-07: qty 1

## 2020-01-07 NOTE — Discharge Instructions (Addendum)
Per our discussion, I have given you a knee immobilizer.  Please wear this as needed for pain in your right knee and for additional stability.  Please also take Tylenol and ibuprofen as needed for management of your pain you can take this every 6-8 hours.  Please apply ice to your right knee.  Try to avoid heat as this can make your swelling worse.  I have given you follow-up information for Dr. Clearance Coots.  He is a sports medicine specialist in this building.  Please follow-up with him on Monday.  Please do not hesitate to return to the emergency department with any new or worsening symptoms.  It was a pleasure to meet you.

## 2020-01-07 NOTE — ED Triage Notes (Signed)
She twisted and her right foot gave out causing her to twisted her right knee and fell onto her knee. Swelling and pain to her knee. Pain in the heel of her right foot. 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

## 2020-01-07 NOTE — ED Provider Notes (Signed)
[facility] EMERGENCY DEPARTMENT Provider Note   CSN: [id number] Arrival date & time: 01/07/20  2034     History Chief Complaint  Patient presents with   Knee Injury    Alexandra Rowe is a 34 y.o. female.  HPI  HPI Comments: Alexandra Rowe is a 34 y.o. female who presents to the Emergency Department complaining of a fall.  Patient states about 7 hours ago she was chasing her son on a gravel covered surface and planted her right foot and twisted the right knee and fell to the ground.  She reports exquisite pain and swelling overlying the right knee.  Patient has difficulty moving the right knee due to pain.  Pain worsens significantly with any palpation overlying the knee.  She reports some mild tingling overlying the knee.  No numbness, syncope.      Past Medical History:  Diagnosis Date   Cancer ([place])    Clear cell chondrosarcoma (HCC)    Hx of echocardiogram    Echo (8/15):  EF 55-60%, no effusion   Hypertension    Obesity     Patient Active Problem List   Diagnosis Date Noted   Numbness and tingling in both hands 08/24/2019   Carpal tunnel syndrome on both sides 08/24/2019   HTN (hypertension) 11/20/2012   Palpitations 08/21/2012    Past Surgical History:  Procedure Laterality Date   ABDOMINAL SURGERY     TOTAL HIP ARTHROPLASTY       OB History   No obstetric history on file.     Family History  Problem Relation Age of Onset   Heart attack Maternal Grandfather    Hypertension Other    Cancer Other    Hypertension Father     Social History   Tobacco Use   Smoking status: Never Smoker   Smokeless tobacco: Never Used  Substance Use Topics   Alcohol use: No   Drug use: No    Home Medications Prior to Admission medications   Medication Sig Start Date End Date Taking? Authorizing Provider  amphetamine-dextroamphetamine (ADDERALL XR) 30 MG 24 hr capsule TAKE 1 CAPSULE BY MOUTH ONCE DAILY IN THE MORNING FOR 30  DAYS 11/10/19  Yes [provider]  BuPROPion HCl (WELLBUTRIN PO) Take by mouth.   Yes [provider]  Linaclotide (LINZESS PO) Take by mouth.   Yes [provider]  metoprolol succinate (TOPROL-XL) 50 MG 24 hr tablet TAKE 1 TABLET BY MOUTH ONCE DAILY WITH OR IMMEDIATELY FOLLOWING A MEAL 07/14/15  Yes Nahser, Wonda Cheng, MD  Atomoxetine HCl (STRATTERA PO) Take by mouth.    [provider]    Allergies    Oxycodone and Codeine  Review of Systems   Review of Systems  Respiratory: Negative for shortness of breath.   Cardiovascular: Negative for chest pain.  Musculoskeletal: Positive for arthralgias and joint swelling.  Skin: Positive for color change and wound.  Neurological: Negative for weakness and numbness.    Physical Exam Updated Vital Signs BP 120/90    Pulse 86    Temp 98.2 F (36.8 C) (Oral)    Resp 20    Ht 5\' 3"  (1.6 m)    Wt 87.1 kg    SpO2 98%    BMI 34.01 kg/m   Physical Exam Vitals and nursing note reviewed.  Constitutional:      General: She is in acute distress.     Appearance: Normal appearance. She is not ill-appearing, toxic-appearing or diaphoretic.  HENT:     Head: Normocephalic and atraumatic.     Right Ear: External ear normal.     Left Ear: External ear normal.     Nose: Nose normal.     Mouth/Throat:     Mouth: Mucous membranes are moist.  Eyes:     Extraocular Movements: Extraocular movements intact.  Cardiovascular:     Rate and Rhythm: Normal rate.     Pulses: Normal pulses.  Pulmonary:     Effort: Pulmonary effort is normal.  Abdominal:     Palpations: Abdomen is soft.  Musculoskeletal:        General: Swelling, tenderness and signs of injury present. No deformity.     Cervical back: Normal range of motion.     Right lower leg: No edema.     Left lower leg: No edema.     Comments: Moderate edema and ecchymosis noted overlying the right knee.  Exquisite TTP noted diffusely over the anterior knee.  Patient can  slowly flex and extend the right knee but difficult to assess range of motion secondary to pain.  No confirmed laxity of the right knee with anterior and posterior drawer, Lachman's, varus and valgus stress.  But again, difficult to assess due to patient's pain.  2+ pedal pulses noted bilaterally.  Distal sensation is intact.  Patient can move bilateral feet and toes spontaneously and without difficulty.  Skin:    General: Skin is warm and dry.  Neurological:     General: No focal deficit present.     Mental Status: She is alert and oriented to person, place, and time.  Psychiatric:        Mood and Affect: Mood normal.        Behavior: Behavior normal.    ED Results / Procedures / Treatments   Labs (all labs ordered are listed, but only abnormal results are displayed) Labs Reviewed - No data to display  EKG None  Radiology DG Knee Complete 4 Views Right  Result Date: 01/07/2020 CLINICAL DATA:  Status post trauma. EXAM: RIGHT KNEE - COMPLETE 4+ VIEW COMPARISON:  None. FINDINGS: No evidence of fracture, dislocation, or joint effusion. No evidence of arthropathy or other focal bone abnormality. Moderate severity soft tissue swelling is seen along the anterior medial aspect of the distal right thigh and anteromedial aspect of the right patella. IMPRESSION: Moderate severity anteromedial soft tissue swelling without evidence of acute osseous abnormality. Electronically Signed   By: Virgina Norfolk M.D.   On: 01/07/2020 21:58   DG Foot Complete Right  Result Date: 01/07/2020 CLINICAL DATA:  Status post fall. EXAM: RIGHT FOOT COMPLETE - 3+ VIEW COMPARISON:  None. FINDINGS: There is no evidence of fracture or dislocation. There is no evidence of arthropathy or other focal bone abnormality. Soft tissues are unremarkable. IMPRESSION: Negative. Electronically Signed   By: Virgina Norfolk M.D.   On: 01/07/2020 22:00    Procedures Procedures (including critical care time)  Medications  Ordered in ED Medications - No data to display  ED Course  I have reviewed the triage vital signs and the nursing notes.  Pertinent labs & imaging results that were available during my care of the patient were reviewed by me and considered in my medical decision making (see chart for details).    MDM Rules/Calculators/A&P                      Patient is a pleasant 34 year old female that presents with sudden  onset right knee pain.  X-rays were obtained of the joint which were negative for any bony abnormalities.  There is moderate anteromedial soft tissue swelling.  I discussed this with the patient.  Physical exam is generally reassuring but it is tough to assess the right knee due to the amount of swelling and pain.  I discussed this patient with my attending physician Dr. Gareth Morgan who agrees the patient can be placed in a knee immobilizer with orthopedic follow-up.  I recommended ice on the joint as well as taking Tylenol and ibuprofen for continued pain.  I recommended range of motion exercises as tolerated.  She understands to return to the emergency department any new or worsening symptoms.  She was amicable with the above plan and her vital signs are stable.  Patient discharged to home/self care.  Condition at discharge: Stable  Note: Portions of this report may have been transcribed using voice recognition software. Every effort was made to ensure accuracy; however, inadvertent computerized transcription errors may be present.    Final Clinical Impression(s) / ED Diagnoses Final diagnoses:  Acute pain of right knee  Contusion of right knee, initial encounter  Effusion of right knee    Rx / DC Orders ED Discharge Orders    None       Rayna Sexton, PA-C 01/07/20 2242    Gareth Morgan, MD 01/09/20 2244

## 2020-01-17 ENCOUNTER — Other Ambulatory Visit: Payer: Self-pay

## 2020-01-17 ENCOUNTER — Ambulatory Visit: Payer: Self-pay

## 2020-01-17 ENCOUNTER — Ambulatory Visit (INDEPENDENT_AMBULATORY_CARE_PROVIDER_SITE_OTHER): Payer: No Typology Code available for payment source | Admitting: Family Medicine

## 2020-01-17 ENCOUNTER — Encounter: Payer: Self-pay | Admitting: Family Medicine

## 2020-01-17 VITALS — BP 124/88 | HR 71 | Ht 63.0 in

## 2020-01-17 DIAGNOSIS — M7041 Prepatellar bursitis, right knee: Secondary | ICD-10-CM | POA: Diagnosis not present

## 2020-01-17 DIAGNOSIS — M25561 Pain in right knee: Secondary | ICD-10-CM

## 2020-01-17 NOTE — Assessment & Plan Note (Signed)
Injury occurred roughly 10 days ago.  No structural changes observed in the knee itself.  Does have an overlying bursitis and hematoma.   -Counseled on supportive care. -Counseled on heat and compression. -Discussed aspiration but will hold off at this time. -Could consider physical therapy to help with mobilization.

## 2020-01-17 NOTE — Progress Notes (Signed)
Alexandra Rowe - 34 y.o. female MRN [id number]  Date of birth: 11-28-1985  SUBJECTIVE:  Including CC & ROS.  Chief Complaint  Patient presents with  . Knee Pain    right x 10 days    Alexandra Rowe is a 34 y.o. female that is presenting with acute right knee pain.  She had an injury where she sustained a fall onto the right knee.  She has had swelling and ecchymosis.  It is localized over the medial femoral condyle.  Pain is improved as of late.  She has been using compression.  No history of similar pain.  No history of surgery..  Independent review of the right knee x-ray from 4/30 shows soft tissue swelling but no joint changes.   Review of Systems See HPI   HISTORY: Past Medical, Surgical, Social, and Family History Reviewed & Updated per EMR.   Pertinent Historical Findings include:  Past Medical History:  Diagnosis Date  . Cancer ([place])   . Clear cell chondrosarcoma (HCC)   . Hx of echocardiogram    Echo (8/15):  EF 55-60%, no effusion  . Hypertension   . Obesity     Past Surgical History:  Procedure Laterality Date  . ABDOMINAL SURGERY    . TOTAL HIP ARTHROPLASTY      Family History  Problem Relation Age of Onset  . Heart attack Maternal Grandfather   . Hypertension Other   . Cancer Other   . Hypertension Father     Social History   Socioeconomic History  . Marital status: Married    Spouse name: Not on file  . Number of children: Not on file  . Years of education: Not on file  . Highest education level: Not on file  Occupational History  . Not on file  Tobacco Use  . Smoking status: Never Smoker  . Smokeless tobacco: Never Used  Substance and Sexual Activity  . Alcohol use: No  . Drug use: No  . Sexual activity: Not on file  Other Topics Concern  . Not on file  Social History Narrative  . Not on file   Social Determinants of Health   Financial Resource Strain:   . Difficulty of Paying Living Expenses:   Food Insecurity:   . Worried  About Charity fundraiser in the Last Year:   . Arboriculturist in the Last Year:   Transportation Needs:   . Film/video editor (Medical):   Marland Kitchen Lack of Transportation (Non-Medical):   Physical Activity:   . Days of Exercise per Week:   . Minutes of Exercise per Session:   Stress:   . Feeling of Stress :   Social Connections:   . Frequency of Communication with Friends and Family:   . Frequency of Social Gatherings with Friends and Family:   . Attends Religious Services:   . Active Member of Clubs or Organizations:   . Attends Archivist Meetings:   Marland Kitchen Marital Status:   Intimate Partner Violence:   . Fear of Current or Ex-Partner:   . Emotionally Abused:   Marland Kitchen Physically Abused:   . Sexually Abused:      PHYSICAL EXAM:  VS: BP 124/88   Pulse 71   Ht 5\' 3"  (1.6 m)   BMI 34.01 kg/m  Physical Exam Gen: NAD, alert, cooperative with exam, well-appearing MSK:  Right knee: Limitations with flexion extension. Ecchymosis and swelling over the medial aspect. Tenderness palpation over the medial retinacular  area. No tenderness palpation over the medial or lateral joint line. Tenderness palpation of the patella. Neurovascularly intact  Limited ultrasound: Right knee:  No effusion noted. Normal appearing quadricep patellar tendon. Prepatellar bursitis appreciated.  There is a hematoma extending from the lateral aspect of the patella superficially to the medial compartment.  This is just involved in the soft tissue. Normal-appearing medial joint space. Normal-appearing lateral joint space.  Summary: Prepatellar bursitis with extension into hematoma of the medial femoral area.Marland Kitchen  Ultrasound and interpretation by Clearance Coots, MD    ASSESSMENT & PLAN:   Prepatellar bursitis of right knee Injury occurred roughly 10 days ago.  No structural changes observed in the knee itself.  Does have an overlying bursitis and hematoma.   -Counseled on supportive  care. -Counseled on heat and compression. -Discussed aspiration but will hold off at this time. -Could consider physical therapy to help with mobilization.

## 2020-01-17 NOTE — Patient Instructions (Signed)
Nice to meet you Please try heat  Please massage the area   Please send me a message in MyChart with any questions or updates.  Please see me back in 4 weeks or as needed if better.   --Dr. Raeford Razor

## 2021-07-16 DIAGNOSIS — F330 Major depressive disorder, recurrent, mild: Secondary | ICD-10-CM | POA: Insufficient documentation

## 2021-07-17 ENCOUNTER — Other Ambulatory Visit (HOSPITAL_COMMUNITY): Payer: Self-pay

## 2021-07-17 DIAGNOSIS — F988 Other specified behavioral and emotional disorders with onset usually occurring in childhood and adolescence: Secondary | ICD-10-CM | POA: Insufficient documentation

## 2021-07-17 DIAGNOSIS — F419 Anxiety disorder, unspecified: Secondary | ICD-10-CM | POA: Insufficient documentation

## 2021-07-17 MED ORDER — LINZESS 290 MCG PO CAPS
ORAL_CAPSULE | ORAL | 1 refills | Status: AC
  Filled 2021-07-17: qty 90, 90d supply, fill #0

## 2022-03-19 ENCOUNTER — Other Ambulatory Visit: Payer: Self-pay

## 2022-03-19 ENCOUNTER — Emergency Department (HOSPITAL_COMMUNITY)
Admission: EM | Admit: 2022-03-19 | Discharge: 2022-03-19 | Disposition: A | Discharge status: Home / Self Care | Payer: BC Managed Care – PPO | Attending: Emergency Medicine | Admitting: Emergency Medicine

## 2022-03-19 ENCOUNTER — Encounter (HOSPITAL_COMMUNITY): Payer: Self-pay | Admitting: Emergency Medicine

## 2022-03-19 ENCOUNTER — Emergency Department (HOSPITAL_COMMUNITY): Payer: BC Managed Care – PPO

## 2022-03-19 DIAGNOSIS — S5012XA Contusion of left forearm, initial encounter: Secondary | ICD-10-CM | POA: Diagnosis not present

## 2022-03-19 DIAGNOSIS — S59912A Unspecified injury of left forearm, initial encounter: Secondary | ICD-10-CM | POA: Diagnosis present

## 2022-03-19 DIAGNOSIS — W19XXXA Unspecified fall, initial encounter: Secondary | ICD-10-CM

## 2022-03-19 DIAGNOSIS — Z79899 Other long term (current) drug therapy: Secondary | ICD-10-CM | POA: Insufficient documentation

## 2022-03-19 NOTE — Discharge Instructions (Signed)
Return if any problems.  Elevate your arm.  Wrap arm with an ace wrap.  Ibuprofen for soreness

## 2022-03-19 NOTE — ED Triage Notes (Signed)
Patient here with complaint of left forearm pain after her arm was stepped on by a horse on this passed Sunday. Bruising on anterior forearm, patient went to work yesterday but came in for evaluation due to worsening pain. No obvious deformity.

## 2022-03-19 NOTE — ED Provider Notes (Signed)
[facility] EMERGENCY DEPARTMENT Provider Note   CSN: [id number] Arrival date & time: 03/19/22  3875     History  Chief Complaint  Patient presents with   Arm Injury    Alexandra Rowe is a 36 y.o. female.  Pt reports she fell off a horse and it stepped on her arm on Sunday.  Pt complains of incresed swelling  The history is provided by the patient. No language interpreter was used.  Arm Injury Injury: no   Pain details:    Quality:  Aching   Radiates to:  Does not radiate   Severity:  Moderate   Onset quality:  Gradual   Progression:  Worsening Foreign body present:  No foreign bodies Tetanus status:  Up to date Relieved by:  Nothing Worsened by:  Nothing Associated symptoms: swelling        Home Medications Prior to Admission medications   Medication Sig Start Date End Date Taking? Authorizing Provider  amphetamine-dextroamphetamine (ADDERALL XR) 30 MG 24 hr capsule TAKE 1 CAPSULE BY MOUTH ONCE DAILY IN THE MORNING FOR 30 DAYS 11/10/19   [provider]  Atomoxetine HCl (STRATTERA PO) Take by mouth.    [provider]  BuPROPion HCl (WELLBUTRIN PO) Take by mouth.    [provider]  Linaclotide (LINZESS PO) Take by mouth.    [provider]  linaclotide (LINZESS) 290 MCG CAPS capsule Take 1 capsule (290 mcg total) by mouth daily as needed. 07/17/21     metoprolol succinate (TOPROL-XL) 50 MG 24 hr tablet TAKE 1 TABLET BY MOUTH ONCE DAILY WITH OR IMMEDIATELY FOLLOWING A MEAL 07/14/15   Nahser, Wonda Cheng, MD      Allergies    Oxycodone and Codeine    Review of Systems   Review of Systems  Skin:  Positive for wound.  All other systems reviewed and are negative.   Physical Exam Updated Vital Signs BP (!) 146/102 (BP Location: Right Arm)   Pulse 81   Temp 98.2 F (36.8 C) (Oral)   Resp 16   SpO2 100%  Physical Exam Vitals and nursing note reviewed.  Constitutional:      Appearance: She is  well-developed.  HENT:     Head: Normocephalic.  Pulmonary:     Effort: Pulmonary effort is normal.  Abdominal:     General: There is no distension.  Musculoskeletal:        General: Swelling and tenderness present.     Cervical back: Normal range of motion.     Comments: bruised swollen left forearm,  abrasions healing .   From  nv and ns intact   Neurological:     Mental Status: She is alert and oriented to person, place, and time.  Psychiatric:        Mood and Affect: Mood normal.     ED Results / Procedures / Treatments   Labs (all labs ordered are listed, but only abnormal results are displayed) Labs Reviewed - No data to display  EKG None  Radiology DG Forearm Left  Result Date: 03/19/2022 CLINICAL DATA:  Trauma.  Pain and bruising. EXAM: LEFT FOREARM - 2 VIEW COMPARISON:  None Available. FINDINGS: No acute fracture or dislocation. Suspect soft tissue swelling/edema about the ulnar side of the distal forearm. IMPRESSION: No acute osseous abnormality. Electronically Signed   By: Abigail Miyamoto M.D.   On: 03/19/2022 08:12    Procedures Procedures    Medications Ordered in ED Medications - No  data to display  ED Course/ Medical Decision Making/ A&P                           Medical Decision Making Amount and/or Complexity of Data Reviewed Radiology: ordered.   Pt advised strict elevation, ibuprofen  ace wrap compression        Final Clinical Impression(s) / ED Diagnoses Final diagnoses:  Fall, initial encounter  Contusion of left forearm, initial encounter    Rx / DC Orders ED Discharge Orders     None      An After Visit Summary was printed and given to the patient.    Fransico Meadow, [place] 03/19/22 0301    Godfrey Pick, MD 03/22/22 [phone]

## 2022-03-19 NOTE — ED Notes (Signed)
Discharge instructions reviewed with patient. Patient denies any questions or concerns. Pt ambulatory out of ED.

## 2022-06-11 LAB — HM PAP SMEAR: HM Pap smear: NORMAL

## 2022-06-13 ENCOUNTER — Other Ambulatory Visit: Payer: Self-pay | Admitting: Obstetrics and Gynecology

## 2022-06-13 DIAGNOSIS — R229 Localized swelling, mass and lump, unspecified: Secondary | ICD-10-CM

## 2022-09-18 ENCOUNTER — Other Ambulatory Visit (HOSPITAL_COMMUNITY): Payer: Self-pay

## 2022-09-18 MED ORDER — BUPROPION HCL ER (XL) 150 MG PO TB24
450.0000 mg | ORAL_TABLET | Freq: Every day | ORAL | 0 refills | Status: DC
  Filled 2022-09-18 – 2022-09-20 (×2): qty 270, 90d supply, fill #0

## 2022-09-20 ENCOUNTER — Other Ambulatory Visit (HOSPITAL_COMMUNITY): Payer: Self-pay

## 2022-12-26 ENCOUNTER — Encounter: Payer: Self-pay | Admitting: *Deleted

## 2023-02-19 ENCOUNTER — Other Ambulatory Visit (HOSPITAL_COMMUNITY): Payer: Self-pay

## 2023-02-19 MED ORDER — ZEPBOUND 7.5 MG/0.5ML ~~LOC~~ SOAJ
7.5000 mg | SUBCUTANEOUS | 0 refills | Status: DC
  Filled 2023-02-19: qty 2, 28d supply, fill #0

## 2023-11-19 ENCOUNTER — Other Ambulatory Visit (HOSPITAL_COMMUNITY): Payer: Self-pay

## 2023-11-19 MED ORDER — ZEPBOUND 10 MG/0.5ML ~~LOC~~ SOAJ
SUBCUTANEOUS | 3 refills | Status: DC
  Filled 2023-11-19: qty 2, 28d supply, fill #0

## 2023-11-20 ENCOUNTER — Other Ambulatory Visit (HOSPITAL_COMMUNITY): Payer: Self-pay

## 2023-12-11 ENCOUNTER — Other Ambulatory Visit (HOSPITAL_COMMUNITY): Payer: Self-pay

## 2023-12-24 ENCOUNTER — Other Ambulatory Visit (HOSPITAL_COMMUNITY): Payer: Self-pay

## 2023-12-24 ENCOUNTER — Encounter: Payer: Self-pay | Admitting: Urgent Care

## 2023-12-24 ENCOUNTER — Ambulatory Visit: Admitting: Urgent Care

## 2023-12-24 VITALS — BP 120/86 | HR 75 | Ht 62.0 in | Wt 169.8 lb

## 2023-12-24 DIAGNOSIS — F902 Attention-deficit hyperactivity disorder, combined type: Secondary | ICD-10-CM

## 2023-12-24 DIAGNOSIS — R002 Palpitations: Secondary | ICD-10-CM

## 2023-12-24 DIAGNOSIS — F411 Generalized anxiety disorder: Secondary | ICD-10-CM | POA: Diagnosis not present

## 2023-12-24 DIAGNOSIS — I10 Essential (primary) hypertension: Secondary | ICD-10-CM | POA: Diagnosis not present

## 2023-12-24 DIAGNOSIS — Z85830 Personal history of malignant neoplasm of bone: Secondary | ICD-10-CM | POA: Diagnosis not present

## 2023-12-24 DIAGNOSIS — E66811 Obesity, class 1: Secondary | ICD-10-CM | POA: Diagnosis not present

## 2023-12-24 DIAGNOSIS — F330 Major depressive disorder, recurrent, mild: Secondary | ICD-10-CM

## 2023-12-24 MED ORDER — BUSPIRONE HCL 10 MG PO TABS
10.0000 mg | ORAL_TABLET | Freq: Two times a day (BID) | ORAL | 3 refills | Status: AC
  Filled 2023-12-24: qty 60, 30d supply, fill #0
  Filled 2024-01-23: qty 60, 30d supply, fill #1

## 2023-12-24 MED ORDER — TIRZEPATIDE 2.5 MG/0.5ML ~~LOC~~ SOAJ
2.5000 mg | SUBCUTANEOUS | 0 refills | Status: DC
  Filled 2023-12-24 – 2024-01-02 (×2): qty 2, 28d supply, fill #0

## 2023-12-24 MED ORDER — BUPROPION HCL ER (XL) 150 MG PO TB24
450.0000 mg | ORAL_TABLET | Freq: Every day | ORAL | 2 refills | Status: AC
  Filled 2023-12-24: qty 270, 90d supply, fill #0
  Filled 2024-06-05 – 2024-06-17 (×2): qty 270, 90d supply, fill #1

## 2023-12-24 MED ORDER — ATOMOXETINE HCL 40 MG PO CAPS
40.0000 mg | ORAL_CAPSULE | Freq: Every day | ORAL | 1 refills | Status: AC
  Filled 2023-12-24: qty 90, 90d supply, fill #0
  Filled 2024-05-04: qty 90, 90d supply, fill #1

## 2023-12-24 MED ORDER — METOPROLOL SUCCINATE ER 25 MG PO TB24
25.0000 mg | ORAL_TABLET | Freq: Every day | ORAL | 2 refills | Status: AC
  Filled 2023-12-24: qty 90, 90d supply, fill #0
  Filled 2024-05-04: qty 90, 90d supply, fill #1
  Filled 2024-09-22 (×2): qty 90, 90d supply, fill #2

## 2023-12-24 NOTE — Patient Instructions (Signed)
 I have refilled all of your chronic medications.  I will try to get Mounjaro approved. If approved, you will start with the 2.5mg  weekly dose, and we will increase monthly.  Please add buspirone to help with your anxiety. Start buspirone taper as discussed: Day 1-4: Take 1/2 tab every morning Day 5-8: Take 1/2 tab every morning and every evening Day 9-12: Take 1 tab in the morning and 1/2 tab every evening Day 13+: Take 1 full tab twice daily   Please return in one month for annual PE with fasting labs

## 2023-12-24 NOTE — Progress Notes (Unsigned)
 New Patient Office Visit  Subjective:  Patient ID: Alexandra Rowe, female    DOB: 02-01-1986  Age: 39 y.o. MRN: [id number]  CC:  Chief Complaint  Patient presents with   Establish Care    New pt est care. Pt will need refills on her meds.    HPI Alexandra Rowe presents to establish care.  BMI 35 prior 1 year ago - gained 15# since stopping the medication in Feb. Zepbound helped improve stools. HTN.  40mg  once daily.  Discussed the use of AI scribe software for clinical note transcription with the patient, who gave verbal consent to proceed.  History of Present Illness   Alexandra Rowe is a 38 year old female who presents for medication management and weight management concerns.  She experiences significant anxiety, particularly at work, and is currently taking Wellbutrin 450 mg daily for anxiety and depression. Initially, Wellbutrin was effective, but now it seems insufficient, especially when taking Strattera for ADD, which she believes increases her anxiety. She takes Strattera 40 mg once daily, which helps with focus and concentration, but she experiences dry mouth as a side effect. Caffeine exacerbates her anxiety, but she uses it to combat sluggishness. Her sleep is limited to about 5-6 hours per night, with difficulty falling asleep and occasional awakenings to use the bathroom.  She has been on weight loss medications in the past, including Mounjaro and Zepbound, which were effective in reducing her weight from a BMI of 35 to 31, with a weight loss of approximately 50 pounds. However, since discontinuing Zepbound in February due to insurance issues, she has regained about 10 pounds and noticed an increase in hunger. Zepbound also improved her bowel movements, allowing her to go daily, whereas she now experiences constipation, sometimes going two to three weeks without a bowel movement. She takes Linzess 145 mcg as needed for constipation, but it causes diarrhea, so she  uses it only when she has no commitments.  She has a history of arthritis in her back, confirmed by x-rays, and takes metoprolol 25 mg daily, reduced from 50 mg, for blood pressure management. She has a Mirena IUD for contraception and uses amoxicillin prophylactically before dental work due to a history of cancer.  Her cholesterol levels were slightly elevated in the past, with total cholesterol 32 points above goal and LDL 40 points above goal. She has no history of sleep apnea, insulin resistance, or prediabetes.       Outpatient Encounter Medications as of 12/24/2023  Medication Sig   amoxicillin (AMOXIL) 500 MG capsule TAKE 4 CAPSULES BY MOUTH 1 HOUR PRIOR TO DENTAL WORK   atomoxetine (STRATTERA) 60 MG capsule Take 1 capsule by mouth daily.   Atomoxetine HCl (STRATTERA PO) Take by mouth.   buPROPion (WELLBUTRIN XL) 150 MG 24 hr tablet Take 3 tablets (450 mg total) by mouth daily.   linaclotide (LINZESS) 290 MCG CAPS capsule Take 1 capsule (290 mcg total) by mouth daily as needed.   metoprolol succinate (TOPROL-XL) 25 MG 24 hr tablet Take 1 tablet by mouth daily.   amphetamine-dextroamphetamine (ADDERALL XR) 30 MG 24 hr capsule TAKE 1 CAPSULE BY MOUTH ONCE DAILY IN THE MORNING FOR 30 DAYS (Patient not taking: Reported on 12/24/2023)   levonorgestrel (MIRENA, 52 MG,) 20 MCG/DAY IUD Take by intrauterine route.   metoprolol succinate (TOPROL-XL) 50 MG 24 hr tablet TAKE 1 TABLET BY MOUTH ONCE DAILY WITH OR IMMEDIATELY FOLLOWING A MEAL (Patient not taking: Reported on 12/24/2023)   tirzepatide (  ZEPBOUND) 10 MG/0.5ML Pen Inject 0.5 mL (10 mg total) under the skin every 7 days. (Patient not taking: Reported on 12/24/2023)   [DISCONTINUED] BuPROPion HCl (WELLBUTRIN PO) Take by mouth.   [DISCONTINUED] Linaclotide (LINZESS PO) Take by mouth.   No facility-administered encounter medications on file as of 12/24/2023.    Past Medical History:  Diagnosis Date   Cancer (HCC)    Clear cell chondrosarcoma  (HCC)    Hx of echocardiogram    Echo (8/15):  EF 55-60%, no effusion   Hypertension    Obesity     Past Surgical History:  Procedure Laterality Date   ABDOMINAL SURGERY     TOTAL HIP ARTHROPLASTY      Family History  Problem Relation Age of Onset   Heart attack Maternal Grandfather    Hypertension Other    Cancer Other    Hypertension Father     Social History   Socioeconomic History   Marital status: Married    Spouse name: Not on file   Number of children: Not on file   Years of education: Not on file   Highest education level: Not on file  Occupational History   Not on file  Tobacco Use   Smoking status: Never   Smokeless tobacco: Never  Substance and Sexual Activity   Alcohol use: No   Drug use: No   Sexual activity: Not on file  Other Topics Concern   Not on file  Social History Narrative   Not on file   Social Drivers of Health   Financial Resource Strain: Low Risk  (08/15/2022)   Received from [facility]   Overall Financial Resource Strain (CARDIA)    Difficulty of Paying Living Expenses: Not very hard  Food Insecurity: Low Risk  (09/15/2023)   Received from [facility]   Hunger Vital Sign    Worried About Running Out of Food in the Last Year: Never true    Ran Out of Food in the Last Year: Never true  Transportation Needs: No Transportation Needs (09/15/2023)   Received from [facility]    In the past 12 months, has lack of reliable transportation kept you from medical appointments, meetings, work or from getting things needed for daily living? : No  Physical Activity: Insufficiently Active (08/15/2022)   Received from [facility]   Exercise Vital Sign    Days of Exercise per Week: 2 days    Minutes of Exercise per Session: 50 min  Stress: Stress Concern Present (08/15/2022)   Received from [facility] - Occupational Stress Questionnaire    Feeling of Stress : Very much  Social  Connections: Socially Integrated (08/15/2022)   Received from [facility]   Social Network    How would you rate your social network (family, work, friends)?: Good participation with social networks  Intimate Partner Violence: Not At Risk (08/15/2022)   Received from [facility]   HITS    Over the last 12 months how often did your partner physically hurt you?: Never    Over the last 12 months how often did your partner insult you or talk down to you?: Never    Over the last 12 months how often did your partner threaten you with physical harm?: Never    Over the last 12 months how often did your partner scream or curse at you?: Sometimes    ROS: as noted in HPI  Objective:  BP  120/86   Pulse 75   Ht 5\' 2"  (1.575 m)   Wt 169 lb 12.8 oz (77 kg)   SpO2 97%   BMI 31.06 kg/m   Physical Exam  {Labs (Optional):23779}  Assessment & Plan:  There are no diagnoses linked to this encounter.  No follow-ups on file.   Mandy Second, PA

## 2023-12-25 ENCOUNTER — Other Ambulatory Visit (HOSPITAL_COMMUNITY): Payer: Self-pay

## 2023-12-25 DIAGNOSIS — E66811 Obesity, class 1: Secondary | ICD-10-CM | POA: Insufficient documentation

## 2023-12-25 DIAGNOSIS — Z85830 Personal history of malignant neoplasm of bone: Secondary | ICD-10-CM | POA: Insufficient documentation

## 2023-12-26 ENCOUNTER — Other Ambulatory Visit (HOSPITAL_COMMUNITY): Payer: Self-pay

## 2023-12-29 ENCOUNTER — Other Ambulatory Visit (HOSPITAL_COMMUNITY): Payer: Self-pay

## 2023-12-29 ENCOUNTER — Telehealth: Payer: Self-pay

## 2023-12-29 NOTE — Telephone Encounter (Signed)
 Pharmacy Patient Advocate Encounter   Received notification from Onbase that prior authorization for Mounjaro  is required/requested.   Insurance verification completed.   The patient is insured through [facility] .   No chart/lab notes supporting Type 2 diabetes, test claim was attempted for Zepbound  instead, as this is specifically for weight loss in non diabetics.  Per test claim: Pt has [employer], which does not currently cover any non diabetes related GLP-1's

## 2024-01-02 ENCOUNTER — Other Ambulatory Visit (HOSPITAL_COMMUNITY): Payer: Self-pay

## 2024-01-05 ENCOUNTER — Other Ambulatory Visit (HOSPITAL_COMMUNITY): Payer: Self-pay

## 2024-01-07 ENCOUNTER — Other Ambulatory Visit (HOSPITAL_COMMUNITY): Payer: Self-pay

## 2024-01-08 ENCOUNTER — Other Ambulatory Visit (HOSPITAL_COMMUNITY): Payer: Self-pay

## 2024-01-21 ENCOUNTER — Encounter: Payer: Self-pay | Admitting: Urgent Care

## 2024-01-21 ENCOUNTER — Telehealth: Payer: Self-pay

## 2024-01-21 ENCOUNTER — Ambulatory Visit (INDEPENDENT_AMBULATORY_CARE_PROVIDER_SITE_OTHER): Admitting: Urgent Care

## 2024-01-21 ENCOUNTER — Other Ambulatory Visit (HOSPITAL_COMMUNITY): Payer: Self-pay

## 2024-01-21 VITALS — BP 130/100 | HR 74 | Ht 62.0 in | Wt 175.0 lb

## 2024-01-21 DIAGNOSIS — Z0000 Encounter for general adult medical examination without abnormal findings: Secondary | ICD-10-CM

## 2024-01-21 DIAGNOSIS — I10 Essential (primary) hypertension: Secondary | ICD-10-CM | POA: Diagnosis not present

## 2024-01-21 DIAGNOSIS — R635 Abnormal weight gain: Secondary | ICD-10-CM | POA: Diagnosis not present

## 2024-01-21 DIAGNOSIS — L680 Hirsutism: Secondary | ICD-10-CM | POA: Diagnosis not present

## 2024-01-21 DIAGNOSIS — Z6832 Body mass index (BMI) 32.0-32.9, adult: Secondary | ICD-10-CM | POA: Diagnosis not present

## 2024-01-21 DIAGNOSIS — E66811 Obesity, class 1: Secondary | ICD-10-CM | POA: Diagnosis not present

## 2024-01-21 LAB — COMPREHENSIVE METABOLIC PANEL WITH GFR
ALT: 17 U/L (ref 0–35)
AST: 19 U/L (ref 0–37)
Albumin: 4.4 g/dL (ref 3.5–5.2)
Alkaline Phosphatase: 42 U/L (ref 39–117)
BUN: 19 mg/dL (ref 6–23)
CO2: 28 meq/L (ref 19–32)
Calcium: 9.8 mg/dL (ref 8.4–10.5)
Chloride: 102 meq/L (ref 96–112)
Creatinine, Ser: 1.05 mg/dL (ref 0.40–1.20)
GFR: 68.08 mL/min (ref >60.00)
Glucose, Bld: 95 mg/dL (ref 70–99)
Potassium: 4.7 meq/L (ref 3.5–5.1)
Sodium: 136 meq/L (ref 135–145)
Total Bilirubin: 0.4 mg/dL (ref 0.2–1.2)
Total Protein: 7.1 g/dL (ref 6.0–8.3)

## 2024-01-21 LAB — CORTISOL: Cortisol, Plasma: 6.3 ug/dL

## 2024-01-21 LAB — CBC
HCT: 41.1 % (ref 36.0–46.0)
Hemoglobin: 13.8 g/dL (ref 12.0–15.0)
MCHC: 33.5 g/dL (ref 30.0–36.0)
MCV: 89.3 fl (ref 78.0–100.0)
Platelets: 407 10*3/uL — ABNORMAL HIGH (ref 150.0–400.0)
RBC: 4.6 Mil/uL (ref 3.87–5.11)
RDW: 13 % (ref 11.5–15.5)
WBC: 6.7 10*3/uL (ref 4.0–10.5)

## 2024-01-21 LAB — LIPID PANEL
Cholesterol: 235 mg/dL — ABNORMAL HIGH (ref 0–200)
HDL: 81 mg/dL (ref >39.00)
LDL Cholesterol: 145 mg/dL — ABNORMAL HIGH (ref 0–99)
NonHDL: 154.14
Total CHOL/HDL Ratio: 3
Triglycerides: 46 mg/dL (ref 0.0–149.0)
VLDL: 9.2 mg/dL (ref 0.0–40.0)

## 2024-01-21 LAB — HEMOGLOBIN A1C: Hgb A1c MFr Bld: 5.5 % (ref 4.6–6.5)

## 2024-01-21 LAB — TSH: TSH: 2.9 u[IU]/mL (ref 0.35–5.50)

## 2024-01-21 LAB — TESTOSTERONE: Testosterone: 48.52 ng/dL — ABNORMAL HIGH (ref 15.00–40.00)

## 2024-01-21 NOTE — Progress Notes (Unsigned)
 Annual Wellness Visit     Patient: Alexandra Rowe, Female    DOB: [birth year], 38 y.o.   MRN: [id number]  Subjective  Chief Complaint  Patient presents with   Annual Exam    Fasting CPE. Pt would like to see if she can have labs done to check her hormone levels.    Alexandra Rowe is a 38 y.o. female who presents today for her Annual Wellness Visit. She reports consuming a general diet. The patient does not participate in regular exercise at present. She generally feels fairly well . She reports sleeping well. She does have additional problems to discuss today.   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   Prepatellar bursitis of right knee 01/17/2020   Numbness and tingling in both hands 08/24/2019   Carpal tunnel syndrome on both sides 08/24/2019   HTN (hypertension) 11/20/2012   Palpitations 08/21/2012   Past Medical History:  Diagnosis Date   Cancer (HCC)    Clear cell chondrosarcoma (HCC)    Depression    Frequent headaches    Hx of echocardiogram    Echo (8/15):  EF 55-60%, no effusion   Hypertension    Obesity    Past Surgical History:  Procedure Laterality Date   ABDOMINAL SURGERY  2017   BREAST ENHANCEMENT SURGERY  2018   TOTAL HIP ARTHROPLASTY  2013   Social History   Tobacco Use   Smoking status: Never   Smokeless tobacco: Never  Substance Use Topics   Alcohol use: Yes   Drug use: No      Medications: Outpatient Medications Prior to Visit  Medication Sig   atomoxetine  (STRATTERA ) 40 MG capsule Take 1 capsule (40 mg total) by mouth daily.   buPROPion  (WELLBUTRIN  XL) 150 MG 24 hr tablet Take 3 tablets (450 mg  total) by mouth daily.   busPIRone  (BUSPAR ) 10 MG tablet Take 1 tablet (10 mg total) by mouth 2 (two) times daily.   levonorgestrel  (MIRENA , 52 MG,) 20 MCG/DAY IUD Take by intrauterine route.   linaclotide  (LINZESS ) 290 MCG CAPS capsule Take 1 capsule (290 mcg total) by mouth daily as needed.   metoprolol  succinate (TOPROL -XL) 25 MG 24 hr tablet Take 1 tablet (25 mg total) by mouth daily.   amoxicillin (AMOXIL) 500 MG capsule TAKE 4 CAPSULES BY MOUTH 1 HOUR PRIOR TO DENTAL WORK (Patient not taking: Reported on 01/21/2024)   tirzepatide  (MOUNJARO ) 2.5 MG/0.5ML Pen Inject 2.5 mg into the skin once a week. (Patient not taking: Reported on 01/21/2024)   No facility-administered medications prior to visit.    Allergies  Allergen Reactions   Oxycodone Nausea And Vomiting    Itching   Codeine Rash    Patient Care Team: Mandy Second, [place] as PCP - General (Physician Assistant)  ROS      Objective  BP (!) 136/94   Pulse 74   Ht 5\' 2"  (1.575 m)   Wt 175 lb (79.4 kg)   SpO2 98%   BMI 32.01 kg/m  BP Readings from Last 3 Encounters:  01/21/24 (!) 136/94  12/24/23 120/86  03/19/22 (!) 137/95   Wt Readings from Last 3  Encounters:  01/21/24 175 lb (79.4 kg)  12/24/23 169 lb 12.8 oz (77 kg)  01/07/20 192 lb (87.1 kg)      Physical Exam    Most recent functional status assessment:     No data to display         Most recent fall risk assessment:    12/24/2023   11:36 AM  Fall Risk   Falls in the past year? 0  Number falls in past yr: 0  Injury with Fall? 0  Risk for fall due to : No Fall Risks  Follow up Falls evaluation completed    Most recent depression screenings:    12/24/2023   11:36 AM  PHQ 2/9 Scores  PHQ - 2 Score 1  PHQ- 9 Score 6   Most recent cognitive screening:     No data to display         Most recent Audit-C alcohol use screening     No data to display         A score of 3 or more in women, and 4 or more in men indicates increased  risk for alcohol abuse, EXCEPT if all of the points are from question 1   Vision/Hearing Screen: No results found.  Last CBC Lab Results  Component Value Date   WBC 7.9 08/17/2012   HGB 10.7 (L) 08/17/2012   HCT 33.0 (L) 08/17/2012   MCV 83.5 08/17/2012   MCH 27.1 08/17/2012   RDW 12.8 08/17/2012   PLT 536 (H) 08/17/2012   Last metabolic panel Lab Results  Component Value Date   GLUCOSE 102 (H) 08/17/2012   NA 139 08/17/2012   K 4.7 08/17/2012   CL 103 08/17/2012   CO2 29 08/17/2012   BUN 11 08/17/2012   CREATININE 0.78 08/17/2012   CALCIUM 10.4 08/17/2012   PROT 7.8 08/17/2012   ALBUMIN 3.3 (L) 08/17/2012   BILITOT 0.2 (L) 08/17/2012   ALKPHOS 69 08/17/2012   AST 23 08/17/2012   ALT 20 08/17/2012   Last lipids No results found for: "CHOL", "HDL", "LDLCALC", "LDLDIRECT", "TRIG", "CHOLHDL" Last hemoglobin A1c No results found for: "HGBA1C" Last thyroid  functions Lab Results  Component Value Date   TSH 3.155 08/17/2012   Last vitamin D No results found for: "25OHVITD2", "25OHVITD3", "VD25OH" Last vitamin B12 and Folate No results found for: "VITAMINB12", "FOLATE"    No results found for any visits on 01/21/24.    Assessment & Plan   Annual wellness visit done today including the all of the following: Reviewed patient's Family Medical History Reviewed and updated list of patient's medical providers Assessment of cognitive impairment was done Assessed patient's functional ability Established a written schedule for health screening services Health Risk Assessent Completed and Reviewed  Exercise Activities and Dietary recommendations  Goals   None     Immunization History  Administered Date(s) Administered   Hep B, Unspecified 05/29/1998, 07/13/1998, 11/23/1998   PFIZER Comirnaty(Gray Top)Covid-19 Tri-Sucrose Vaccine 01/07/2021, 01/25/2021, 02/15/2021   Tdap 09/09/2000    Health Maintenance  Topic Date Due   DTaP/Tdap/Td (2 - Td or Tdap)  09/09/2010   Hepatitis C Screening  01/20/2025 (Originally 12/19/2004)   COVID-19 Vaccine (4 - 2024-25 season) 02/03/2025 (Originally 05/11/2023)   HIV Screening  01/18/2026 (Originally 12/19/2001)   INFLUENZA VACCINE  04/09/2024   Cervical Cancer Screening (HPV/Pap Cotest)  06/11/2025   HPV VACCINES  Aged Out   Meningococcal B Vaccine  Aged Out     Discussed health benefits  of physical activity, and encouraged her to engage in regular exercise appropriate for her age and condition.    Problem List Items Addressed This Visit   None   No follow-ups on file.     Mandy Second, PA

## 2024-01-21 NOTE — Patient Instructions (Signed)
 Your wellness visit was completed today. Please continue all your home medications, but add the night-time dose of buspar . This will likely help with your blood pressure. Monitor BP once daily at home and send me results via Mychart.  We drew labs and will further determine the best treatment option to help with weight loss pending the results.

## 2024-01-21 NOTE — Telephone Encounter (Signed)
 Following up on PA for Zepbound  since pts Mounjaro  was denied. Please advise

## 2024-01-22 ENCOUNTER — Ambulatory Visit: Payer: Self-pay | Admitting: Urgent Care

## 2024-01-22 ENCOUNTER — Other Ambulatory Visit (HOSPITAL_COMMUNITY): Payer: Self-pay

## 2024-01-22 DIAGNOSIS — L680 Hirsutism: Secondary | ICD-10-CM

## 2024-01-22 LAB — INSULIN AND C-PEPTIDE, SERUM
C-Peptide: 2.5 ng/mL (ref 1.1–4.4)
INSULIN: 9.5 u[IU]/mL (ref 2.6–24.9)

## 2024-01-22 LAB — FSH/LH
FSH: 8.2 m[IU]/mL
LH: 12.7 m[IU]/mL

## 2024-01-22 MED ORDER — SPIRONOLACTONE 25 MG PO TABS
25.0000 mg | ORAL_TABLET | Freq: Every day | ORAL | 3 refills | Status: AC
  Filled 2024-01-22: qty 30, 30d supply, fill #0
  Filled 2024-03-01: qty 30, 30d supply, fill #1

## 2024-01-22 MED ORDER — METFORMIN HCL 500 MG PO TABS
500.0000 mg | ORAL_TABLET | Freq: Every day | ORAL | 3 refills | Status: AC
  Filled 2024-01-22: qty 30, 30d supply, fill #0

## 2024-01-23 ENCOUNTER — Other Ambulatory Visit (HOSPITAL_COMMUNITY): Payer: Self-pay

## 2024-01-23 NOTE — Telephone Encounter (Signed)
 Pts Mounjaro  was denied by insurance.

## 2024-01-23 NOTE — Telephone Encounter (Signed)
 We discussed at pts office visit that no GLP-1 is covered. She is aware and we will try alternative. thanks

## 2024-01-23 NOTE — Telephone Encounter (Signed)
 No further action needed.

## 2024-04-14 ENCOUNTER — Ambulatory Visit: Admitting: Urgent Care

## 2024-04-19 ENCOUNTER — Ambulatory Visit: Admitting: Urgent Care

## 2024-06-15 ENCOUNTER — Other Ambulatory Visit (HOSPITAL_COMMUNITY): Payer: Self-pay

## 2024-06-17 ENCOUNTER — Other Ambulatory Visit (HOSPITAL_COMMUNITY): Payer: Self-pay

## 2024-09-22 ENCOUNTER — Other Ambulatory Visit (HOSPITAL_COMMUNITY): Payer: Self-pay

## 2024-10-11 ENCOUNTER — Telehealth: Admitting: Family Medicine

## 2024-10-11 ENCOUNTER — Other Ambulatory Visit (HOSPITAL_BASED_OUTPATIENT_CLINIC_OR_DEPARTMENT_OTHER): Payer: Self-pay

## 2024-10-11 ENCOUNTER — Other Ambulatory Visit: Payer: Self-pay

## 2024-10-11 DIAGNOSIS — N760 Acute vaginitis: Secondary | ICD-10-CM

## 2024-10-11 DIAGNOSIS — B9689 Other specified bacterial agents as the cause of diseases classified elsewhere: Secondary | ICD-10-CM

## 2024-10-11 MED ORDER — METRONIDAZOLE 500 MG PO TABS
500.0000 mg | ORAL_TABLET | Freq: Two times a day (BID) | ORAL | 0 refills | Status: AC
  Filled 2024-10-11 – 2024-10-12 (×2): qty 14, 7d supply, fill #0

## 2024-10-11 MED ORDER — FLUCONAZOLE 150 MG PO TABS
150.0000 mg | ORAL_TABLET | ORAL | 0 refills | Status: AC
  Filled 2024-10-11 – 2024-10-12 (×2): qty 2, 7d supply, fill #0

## 2024-10-11 NOTE — Progress Notes (Signed)
 " Virtual Visit Consent   Alexandra Rowe, you are scheduled for a virtual visit with a [facility] provider today. Just as with appointments in the office, your consent must be obtained to participate. Your consent will be active for this visit and any virtual visit you may have with one of our providers in the next 365 days. If you have a MyChart account, a copy of this consent can be sent to you electronically.  As this is a virtual visit, video technology does not allow for your provider to perform a traditional examination. This may limit your provider's ability to fully assess your condition. If your provider identifies any concerns that need to be evaluated in person or the need to arrange testing (such as labs, EKG, etc.), we will make arrangements to do so. Although advances in technology are sophisticated, we cannot ensure that it will always work on either your end or our end. If the connection with a video visit is poor, the visit may have to be switched to a telephone visit. With either a video or telephone visit, we are not always able to ensure that we have a secure connection.  By engaging in this virtual visit, you consent to the provision of healthcare and authorize for your insurance to be billed (if applicable) for the services provided during this visit. Depending on your insurance coverage, you may receive a charge related to this service.  I need to obtain your verbal consent now. Are you willing to proceed with your visit today? Alexandra Rowe has provided verbal consent on 10/11/2024 for a virtual visit (video or telephone). Alexandra [name], NP  Date: 10/11/2024 12:14 PM   Virtual Visit via Video Note   I, Alexandra Rowe, connected with  Alexandra Rowe  ([id number], 06/14/1986) on 10/11/24 at 12:15 PM EST by a video-enabled telemedicine application and verified that I am speaking with the correct person using two identifiers.  Location: Patient: Virtual Visit Location  Patient: Home Provider: Virtual Visit Location Provider: Home Office   I discussed the limitations of evaluation and management by telemedicine and the availability of in person appointments. The patient expressed understanding and agreed to proceed.    History of Present Illness: Alexandra Rowe is a 39 y.o. who identifies as a female who was assigned female at birth, and is being seen today for odor post vaginal intercourse  Onset was the last 2 days- odor post having intercourse   Associated symptoms are none Modifying factors are douching  Denies chest pain, shortness of breath, fevers, chills, UTI symptoms, no blood in urine or stool  Problems:  Patient Active Problem List   Diagnosis Date Noted   Obesity (BMI 30.0-34.9) 12/25/2023   History of bone cancer 12/25/2023   Anxiety disorder 07/17/2021   Attention deficit disorder 07/17/2021   Mild episode of recurrent major depressive disorder 07/16/2021   Prepatellar bursitis of right knee 01/17/2020   Numbness and tingling in both hands 08/24/2019   Carpal tunnel syndrome on both sides 08/24/2019   HTN (hypertension) 11/20/2012   Palpitations 08/21/2012    Allergies: Allergies[1] Medications: Current Medications[2]  Observations/Objective: Patient is well-developed, well-nourished in no acute distress.  Resting comfortably  at home.  Head is normocephalic, atraumatic.  No labored breathing.  Speech is clear and coherent with logical content.  Patient is alert and oriented at baseline.    Assessment and Plan:  1. BV (bacterial vaginosis) (Primary)  - metroNIDAZOLE  (FLAGYL ) 500 MG  tablet; Take 1 tablet (500 mg total) by mouth 2 (two) times daily for 7 days.  Dispense: 14 tablet; Refill: 0 - fluconazole  (DIFLUCAN ) 150 MG tablet; Take 1 tablet (150 mg total) by mouth as directed. Repeat in 7 days as needed  Dispense: 2 tablet; Refill: 0   -no other red flags for PID or UTI or STI -increase fluids -complete medication  as discussed -prevention discussed and on AVS    Reviewed side effects, risks and benefits of medication.    Patient acknowledged agreement and understanding of the plan.   Past Medical, Surgical, Social History, Allergies, and Medications have been Reviewed.    Follow Up Instructions: I discussed the assessment and treatment plan with the patient. The patient was provided an opportunity to ask questions and all were answered. The patient agreed with the plan and demonstrated an understanding of the instructions.  A copy of instructions were sent to the patient via MyChart unless otherwise noted below.     The patient was advised to call back or seek an in-person evaluation if the symptoms worsen or if the condition fails to improve as anticipated.    Alexandra [name], NP     [1]  Allergies Allergen Reactions   Oxycodone Nausea And Vomiting    Itching   Codeine Rash  [2]  Current Outpatient Medications:    atomoxetine  (STRATTERA ) 40 MG capsule, Take 1 capsule (40 mg total) by mouth daily., Disp: 90 capsule, Rfl: 1   buPROPion  (WELLBUTRIN  XL) 150 MG 24 hr tablet, Take 3 tablets (450 mg total) by mouth daily., Disp: 270 tablet, Rfl: 2   busPIRone  (BUSPAR ) 10 MG tablet, Take 1 tablet (10 mg total) by mouth 2 (two) times daily., Disp: 60 tablet, Rfl: 3   levonorgestrel  (MIRENA , 52 MG,) 20 MCG/DAY IUD, Take by intrauterine route., Disp: , Rfl:    linaclotide  (LINZESS ) 290 MCG CAPS capsule, Take 1 capsule (290 mcg total) by mouth daily as needed., Disp: 90 capsule, Rfl: 1   metFORMIN  (GLUCOPHAGE ) 500 MG tablet, Take 1 tablet (500 mg total) by mouth daily with breakfast., Disp: 30 tablet, Rfl: 3   metoprolol  succinate (TOPROL -XL) 25 MG 24 hr tablet, Take 1 tablet (25 mg total) by mouth daily., Disp: 90 tablet, Rfl: 2   spironolactone  (ALDACTONE ) 25 MG tablet, Take 1 tablet (25 mg total) by mouth daily., Disp: 30 tablet, Rfl: 3  "

## 2024-10-12 ENCOUNTER — Other Ambulatory Visit: Payer: Self-pay

## 2024-10-12 ENCOUNTER — Other Ambulatory Visit (HOSPITAL_COMMUNITY): Payer: Self-pay
